# Patient Record
Sex: Male | Born: 1935 | Race: Black or African American | Hispanic: No | Smoking: Former smoker
Health system: Southern US, Community
[De-identification: ages and names within clinical notes are randomized; demographics above are authoritative.]

## PROBLEM LIST (undated history)

## (undated) DIAGNOSIS — Z21 Asymptomatic human immunodeficiency virus [HIV] infection status: Secondary | ICD-10-CM

## (undated) DIAGNOSIS — F0390 Unspecified dementia without behavioral disturbance: Secondary | ICD-10-CM

## (undated) DIAGNOSIS — I10 Essential (primary) hypertension: Secondary | ICD-10-CM

## (undated) DIAGNOSIS — E119 Type 2 diabetes mellitus without complications: Secondary | ICD-10-CM

## (undated) DIAGNOSIS — M1990 Unspecified osteoarthritis, unspecified site: Secondary | ICD-10-CM

## (undated) DIAGNOSIS — B20 Human immunodeficiency virus [HIV] disease: Secondary | ICD-10-CM

---

## 2007-01-17 ENCOUNTER — Other Ambulatory Visit: Payer: Self-pay

## 2007-01-17 ENCOUNTER — Ambulatory Visit: Payer: Self-pay | Admitting: Ophthalmology

## 2007-01-22 ENCOUNTER — Ambulatory Visit: Payer: Self-pay | Admitting: Ophthalmology

## 2007-06-11 ENCOUNTER — Other Ambulatory Visit: Payer: Self-pay

## 2007-06-11 ENCOUNTER — Ambulatory Visit: Payer: Self-pay | Admitting: Ophthalmology

## 2007-07-02 ENCOUNTER — Ambulatory Visit: Payer: Self-pay | Admitting: Ophthalmology

## 2007-08-13 ENCOUNTER — Ambulatory Visit: Payer: Self-pay | Admitting: Family Medicine

## 2007-08-24 ENCOUNTER — Ambulatory Visit: Payer: Self-pay | Admitting: Family Medicine

## 2007-11-19 ENCOUNTER — Other Ambulatory Visit: Payer: Self-pay

## 2007-11-19 ENCOUNTER — Emergency Department: Payer: Self-pay | Admitting: Emergency Medicine

## 2009-06-01 ENCOUNTER — Ambulatory Visit: Payer: Self-pay | Admitting: Gastroenterology

## 2009-06-11 ENCOUNTER — Emergency Department: Payer: Self-pay | Admitting: Internal Medicine

## 2009-10-22 ENCOUNTER — Emergency Department: Payer: Self-pay | Admitting: Emergency Medicine

## 2009-11-20 ENCOUNTER — Observation Stay: Payer: Self-pay | Admitting: Internal Medicine

## 2011-06-30 ENCOUNTER — Emergency Department: Payer: Self-pay | Admitting: Emergency Medicine

## 2011-06-30 LAB — CBC WITH DIFFERENTIAL/PLATELET
Basophil #: 0.1 10*3/uL (ref 0.0–0.1)
Basophil %: 1.3 %
Eosinophil #: 0.2 10*3/uL (ref 0.0–0.7)
HCT: 41.1 % (ref 40.0–52.0)
Lymphocyte #: 2 10*3/uL (ref 1.0–3.6)
Lymphocyte %: 36.2 %
MCH: 35.6 pg — ABNORMAL HIGH (ref 26.0–34.0)
MCHC: 34 g/dL (ref 32.0–36.0)
Monocyte %: 11.9 %
Neutrophil #: 2.5 10*3/uL (ref 1.4–6.5)
Platelet: 198 10*3/uL (ref 150–440)
RDW: 13.3 % (ref 11.5–14.5)
WBC: 5.4 10*3/uL (ref 3.8–10.6)

## 2011-06-30 LAB — COMPREHENSIVE METABOLIC PANEL
Albumin: 3.9 g/dL (ref 3.4–5.0)
Alkaline Phosphatase: 59 U/L (ref 50–136)
BUN: 27 mg/dL — ABNORMAL HIGH (ref 7–18)
Bilirubin,Total: 0.4 mg/dL (ref 0.2–1.0)
Chloride: 105 mmol/L (ref 98–107)
Creatinine: 1.55 mg/dL — ABNORMAL HIGH (ref 0.60–1.30)
Glucose: 188 mg/dL — ABNORMAL HIGH (ref 65–99)
Potassium: 3.9 mmol/L (ref 3.5–5.1)
SGPT (ALT): 34 U/L
Sodium: 142 mmol/L (ref 136–145)
Total Protein: 8.2 g/dL (ref 6.4–8.2)

## 2011-06-30 LAB — LIPASE, BLOOD: Lipase: 146 U/L (ref 73–393)

## 2012-02-20 ENCOUNTER — Ambulatory Visit: Payer: Self-pay | Admitting: Ophthalmology

## 2014-09-05 ENCOUNTER — Emergency Department: Payer: Self-pay | Admitting: Student

## 2015-07-09 ENCOUNTER — Inpatient Hospital Stay
Admission: EM | Admit: 2015-07-09 | Discharge: 2015-07-12 | DRG: 975 | Disposition: A | Discharge status: Skilled Nursing Facility | Payer: Medicare Other | Attending: Internal Medicine | Admitting: Internal Medicine

## 2015-07-09 ENCOUNTER — Encounter: Payer: Self-pay | Admitting: Internal Medicine

## 2015-07-09 ENCOUNTER — Emergency Department: Payer: Medicare Other

## 2015-07-09 DIAGNOSIS — Z993 Dependence on wheelchair: Secondary | ICD-10-CM | POA: Diagnosis not present

## 2015-07-09 DIAGNOSIS — G9341 Metabolic encephalopathy: Secondary | ICD-10-CM | POA: Diagnosis present

## 2015-07-09 DIAGNOSIS — Z8042 Family history of malignant neoplasm of prostate: Secondary | ICD-10-CM

## 2015-07-09 DIAGNOSIS — Z820 Family history of epilepsy and other diseases of the nervous system: Secondary | ICD-10-CM

## 2015-07-09 DIAGNOSIS — J209 Acute bronchitis, unspecified: Secondary | ICD-10-CM | POA: Diagnosis present

## 2015-07-09 DIAGNOSIS — E872 Acidosis: Secondary | ICD-10-CM | POA: Diagnosis present

## 2015-07-09 DIAGNOSIS — Y95 Nosocomial condition: Secondary | ICD-10-CM | POA: Diagnosis present

## 2015-07-09 DIAGNOSIS — J189 Pneumonia, unspecified organism: Principal | ICD-10-CM

## 2015-07-09 DIAGNOSIS — R740 Nonspecific elevation of levels of transaminase and lactic acid dehydrogenase [LDH]: Secondary | ICD-10-CM | POA: Diagnosis present

## 2015-07-09 DIAGNOSIS — F0390 Unspecified dementia without behavioral disturbance: Secondary | ICD-10-CM | POA: Diagnosis present

## 2015-07-09 DIAGNOSIS — I69351 Hemiplegia and hemiparesis following cerebral infarction affecting right dominant side: Secondary | ICD-10-CM

## 2015-07-09 DIAGNOSIS — R400 Somnolence: Secondary | ICD-10-CM

## 2015-07-09 DIAGNOSIS — M1990 Unspecified osteoarthritis, unspecified site: Secondary | ICD-10-CM | POA: Diagnosis present

## 2015-07-09 DIAGNOSIS — E860 Dehydration: Secondary | ICD-10-CM | POA: Diagnosis present

## 2015-07-09 DIAGNOSIS — J0100 Acute maxillary sinusitis, unspecified: Secondary | ICD-10-CM

## 2015-07-09 DIAGNOSIS — Z7401 Bed confinement status: Secondary | ICD-10-CM

## 2015-07-09 DIAGNOSIS — I10 Essential (primary) hypertension: Secondary | ICD-10-CM | POA: Diagnosis present

## 2015-07-09 DIAGNOSIS — N179 Acute kidney failure, unspecified: Secondary | ICD-10-CM | POA: Diagnosis present

## 2015-07-09 DIAGNOSIS — B20 Human immunodeficiency virus [HIV] disease: Secondary | ICD-10-CM | POA: Diagnosis present

## 2015-07-09 DIAGNOSIS — E119 Type 2 diabetes mellitus without complications: Secondary | ICD-10-CM | POA: Diagnosis present

## 2015-07-09 DIAGNOSIS — Z888 Allergy status to other drugs, medicaments and biological substances status: Secondary | ICD-10-CM | POA: Diagnosis not present

## 2015-07-09 DIAGNOSIS — Z87891 Personal history of nicotine dependence: Secondary | ICD-10-CM | POA: Diagnosis not present

## 2015-07-09 DIAGNOSIS — R7989 Other specified abnormal findings of blood chemistry: Secondary | ICD-10-CM

## 2015-07-09 HISTORY — DX: Unspecified osteoarthritis, unspecified site: M19.90

## 2015-07-09 HISTORY — DX: Asymptomatic human immunodeficiency virus (hiv) infection status: Z21

## 2015-07-09 HISTORY — DX: Unspecified dementia, unspecified severity, without behavioral disturbance, psychotic disturbance, mood disturbance, and anxiety: F03.90

## 2015-07-09 HISTORY — DX: Human immunodeficiency virus (HIV) disease: B20

## 2015-07-09 HISTORY — DX: Type 2 diabetes mellitus without complications: E11.9

## 2015-07-09 HISTORY — DX: Essential (primary) hypertension: I10

## 2015-07-09 LAB — COMPREHENSIVE METABOLIC PANEL
ALK PHOS: 54 U/L (ref 38–126)
ALT: 27 U/L (ref 17–63)
ANION GAP: 10 (ref 5–15)
AST: 78 U/L — ABNORMAL HIGH (ref 15–41)
Albumin: 3 g/dL — ABNORMAL LOW (ref 3.5–5.0)
BILIRUBIN TOTAL: 1.1 mg/dL (ref 0.3–1.2)
BUN: 29 mg/dL — ABNORMAL HIGH (ref 6–20)
CALCIUM: 9.1 mg/dL (ref 8.9–10.3)
CO2: 27 mmol/L (ref 22–32)
Chloride: 104 mmol/L (ref 101–111)
Creatinine, Ser: 1.7 mg/dL — ABNORMAL HIGH (ref 0.61–1.24)
GFR calc non Af Amer: 34 mL/min — ABNORMAL LOW (ref >60)
GFR, EST AFRICAN AMERICAN: 39 mL/min — AB (ref >60)
Glucose, Bld: 226 mg/dL — ABNORMAL HIGH (ref 65–99)
Potassium: 4.2 mmol/L (ref 3.5–5.1)
Sodium: 141 mmol/L (ref 135–145)
TOTAL PROTEIN: 7.6 g/dL (ref 6.5–8.1)

## 2015-07-09 LAB — TROPONIN I: Troponin I: <0.03 ng/mL (ref <0.031)

## 2015-07-09 LAB — CBC WITH DIFFERENTIAL/PLATELET
BASOS ABS: 0 10*3/uL (ref 0–0.1)
Basophils Relative: 1 %
Eosinophils Absolute: 0 10*3/uL (ref 0–0.7)
Eosinophils Relative: 0 %
HEMATOCRIT: 40.3 % (ref 40.0–52.0)
Hemoglobin: 13.2 g/dL (ref 13.0–18.0)
LYMPHS ABS: 0.9 10*3/uL — AB (ref 1.0–3.6)
LYMPHS PCT: 14 %
MCH: 31.5 pg (ref 26.0–34.0)
MCHC: 32.8 g/dL (ref 32.0–36.0)
MCV: 96.1 fL (ref 80.0–100.0)
MONO ABS: 0.9 10*3/uL (ref 0.2–1.0)
MONOS PCT: 15 %
Neutro Abs: 4.1 10*3/uL (ref 1.4–6.5)
Neutrophils Relative %: 70 %
Platelets: 116 10*3/uL — ABNORMAL LOW (ref 150–440)
RBC: 4.19 MIL/uL — ABNORMAL LOW (ref 4.40–5.90)
RDW: 13.1 % (ref 11.5–14.5)
WBC: 6 10*3/uL (ref 3.8–10.6)

## 2015-07-09 LAB — URINALYSIS COMPLETE WITH MICROSCOPIC (ARMC ONLY)
BILIRUBIN URINE: NEGATIVE
Cellular Cast, UA: 6
Glucose, UA: 50 mg/dL — AB
HGB URINE DIPSTICK: NEGATIVE
KETONES UR: NEGATIVE mg/dL
LEUKOCYTES UA: NEGATIVE
Nitrite: NEGATIVE
PH: 5 (ref 5.0–8.0)
Protein, ur: 100 mg/dL — AB
Specific Gravity, Urine: 1.024 (ref 1.005–1.030)

## 2015-07-09 LAB — GLUCOSE, CAPILLARY: GLUCOSE-CAPILLARY: 205 mg/dL — AB (ref 65–99)

## 2015-07-09 LAB — LACTIC ACID, PLASMA
LACTIC ACID, VENOUS: 2.1 mmol/L — AB (ref 0.5–2.0)
LACTIC ACID, VENOUS: 1.8 mmol/L (ref 0.5–2.0)

## 2015-07-09 LAB — VALPROIC ACID LEVEL: Valproic Acid Lvl: 47 ug/mL — ABNORMAL LOW (ref 50.0–100.0)

## 2015-07-09 LAB — BRAIN NATRIURETIC PEPTIDE: B Natriuretic Peptide: 255 pg/mL — ABNORMAL HIGH (ref 0.0–100.0)

## 2015-07-09 MED ORDER — DEXTROSE 5 % IV SOLN
250.0000 mg | INTRAVENOUS | Status: DC
  Administered 2015-07-09 – 2015-07-10 (×2): 250 mg via INTRAVENOUS
  Filled 2015-07-09 (×3): qty 250

## 2015-07-09 MED ORDER — ONDANSETRON HCL 4 MG PO TABS: 4.0000 mg | ORAL_TABLET | ORAL | Status: DC | PRN

## 2015-07-09 MED ORDER — PIPERACILLIN-TAZOBACTAM 3.375 G IVPB
3.3750 g | Freq: Once | INTRAVENOUS | Status: AC
  Administered 2015-07-09: 3.375 g via INTRAVENOUS
  Filled 2015-07-09: qty 50

## 2015-07-09 MED ORDER — NEVIRAPINE 200 MG PO TABS
200.0000 mg | ORAL_TABLET | Freq: Every day | ORAL | Status: DC
  Filled 2015-07-09 (×2): qty 1

## 2015-07-09 MED ORDER — DOCUSATE SODIUM 100 MG PO CAPS
100.0000 mg | ORAL_CAPSULE | Freq: Every day | ORAL | Status: DC
  Administered 2015-07-10 – 2015-07-12 (×3): 100 mg via ORAL
  Filled 2015-07-09 (×4): qty 1

## 2015-07-09 MED ORDER — MEMANTINE HCL ER 14 MG PO CP24
28.0000 mg | ORAL_CAPSULE | Freq: Every day | ORAL | Status: DC
  Administered 2015-07-10 – 2015-07-12 (×3): 28 mg via ORAL
  Filled 2015-07-09 (×3): qty 2

## 2015-07-09 MED ORDER — SULINDAC 200 MG PO TABS
100.0000 mg | ORAL_TABLET | Freq: Two times a day (BID) | ORAL | Status: DC
Start: 2015-07-09 — End: 2015-07-12
  Administered 2015-07-09 – 2015-07-12 (×6): 100 mg via ORAL
  Filled 2015-07-09 (×7): qty 1

## 2015-07-09 MED ORDER — LATANOPROST 0.005 % OP SOLN
1.0000 [drp] | Freq: Every day | OPHTHALMIC | Status: DC
  Administered 2015-07-09 – 2015-07-11 (×3): 1 [drp] via OPHTHALMIC
  Filled 2015-07-09: qty 2.5

## 2015-07-09 MED ORDER — OMEPRAZOLE MAGNESIUM 20 MG PO TBEC: 20.0000 mg | DELAYED_RELEASE_TABLET | Freq: Every day | ORAL | Status: DC

## 2015-07-09 MED ORDER — DORZOLAMIDE HCL-TIMOLOL MAL 2-0.5 % OP SOLN
1.0000 [drp] | Freq: Two times a day (BID) | OPHTHALMIC | Status: DC
  Administered 2015-07-09 – 2015-07-12 (×6): 1 [drp] via OPHTHALMIC
  Filled 2015-07-09: qty 10

## 2015-07-09 MED ORDER — INSULIN GLARGINE 100 UNIT/ML ~~LOC~~ SOLN
20.0000 [IU] | Freq: Every day | SUBCUTANEOUS | Status: DC
  Administered 2015-07-09 – 2015-07-10 (×2): 20 [IU] via SUBCUTANEOUS
  Filled 2015-07-09 (×3): qty 0.2

## 2015-07-09 MED ORDER — PANTOPRAZOLE SODIUM 40 MG PO TBEC
40.0000 mg | DELAYED_RELEASE_TABLET | Freq: Every day | ORAL | Status: DC
  Administered 2015-07-10 – 2015-07-12 (×3): 40 mg via ORAL
  Filled 2015-07-09 (×4): qty 1

## 2015-07-09 MED ORDER — DIVALPROEX SODIUM 250 MG PO DR TAB
250.0000 mg | DELAYED_RELEASE_TABLET | Freq: Two times a day (BID) | ORAL | Status: DC
  Administered 2015-07-09 – 2015-07-12 (×6): 250 mg via ORAL
  Filled 2015-07-09 (×6): qty 1

## 2015-07-09 MED ORDER — SODIUM CHLORIDE 0.9 % IV SOLN
INTRAVENOUS | Status: DC
  Administered 2015-07-09 – 2015-07-12 (×5): via INTRAVENOUS

## 2015-07-09 MED ORDER — DEXTROSE 5 % IV SOLN
1.0000 g | INTRAVENOUS | Status: DC
  Administered 2015-07-09 – 2015-07-11 (×3): 1 g via INTRAVENOUS
  Filled 2015-07-09 (×4): qty 10

## 2015-07-09 MED ORDER — ASPIRIN 81 MG PO TABS: 81.0000 mg | ORAL_TABLET | Freq: Every day | ORAL | Status: DC

## 2015-07-09 MED ORDER — ASPIRIN EC 81 MG PO TBEC
81.0000 mg | DELAYED_RELEASE_TABLET | Freq: Every day | ORAL | Status: DC
  Administered 2015-07-10 – 2015-07-12 (×3): 81 mg via ORAL
  Filled 2015-07-09 (×4): qty 1

## 2015-07-09 MED ORDER — INSULIN GLARGINE 100 UNITS/ML SOLOSTAR PEN
20.0000 [IU] | PEN_INJECTOR | Freq: Every day | SUBCUTANEOUS | Status: DC
  Filled 2015-07-09: qty 3

## 2015-07-09 MED ORDER — HEPARIN SODIUM (PORCINE) 5000 UNIT/ML IJ SOLN
5000.0000 [IU] | Freq: Three times a day (TID) | INTRAMUSCULAR | Status: DC
  Administered 2015-07-09 – 2015-07-11 (×5): 5000 [IU] via SUBCUTANEOUS
  Filled 2015-07-09 (×5): qty 1

## 2015-07-09 MED ORDER — ADULT MULTIVITAMIN W/MINERALS CH
1.0000 | ORAL_TABLET | Freq: Every day | ORAL | Status: DC
  Administered 2015-07-10 – 2015-07-12 (×3): 1 via ORAL
  Filled 2015-07-09 (×3): qty 1

## 2015-07-09 MED ORDER — EMTRICITABINE-TENOFOVIR AF 200-25 MG PO TABS
1.0000 | ORAL_TABLET | Freq: Every day | ORAL | Status: DC
  Administered 2015-07-10: 1 via ORAL
  Filled 2015-07-09 (×2): qty 1

## 2015-07-09 NOTE — H&P (Addendum)
[facility] Physicians - [place] at [facility]   PATIENT NAME: Terry Perry    MR#:  [id number]  DATE OF BIRTH:  Oct 14, 1922  DATE OF ADMISSION:  07/09/2015  PRIMARY CARE PHYSICIAN: Derwood Kaplan, MD   REQUESTING/REFERRING PHYSICIAN: Darnelle Catalan  CHIEF COMPLAINT:   Chief Complaint  Patient presents with  . Altered Mental Status    HISTORY OF PRESENT ILLNESS: Jabez Molner  is a 82 y.o. male with a known history of diabetes, hypertension, dementia, HIV, arthritis- lives at nursing home and wheelchair-bound, was seen completely at his baseline 2 days ago by daughter at nursing home. Today she got a call from the nursing home that he is drowsy and so they have to transfer him to emergency room. Daughter present in the room in ER and she agrees that her father looks more drowsy and lethargic today. She denies any complaints by him. In workup he was noted to have slightly elevated lactic acid and borderline low blood pressure. Chest x-ray was having some questionable infiltrate or atelectasis, and there is no other source of infection. She is given his admission to hospitalist team for possible pneumonia.  PAST MEDICAL HISTORY:   Past Medical History  Diagnosis Date  . Diabetes mellitus without complication (HCC)   . Hypertension   . Dementia   . Arthritis   . HIV (human immunodeficiency virus infection) (HCC)     PAST SURGICAL HISTORY: No past surgical history on file.  SOCIAL HISTORY:  Social History  Substance Use Topics  . Smoking status: Former Games developer  . Smokeless tobacco: Not on file  . Alcohol Use: No    FAMILY HISTORY:  Family History  Problem Relation Age of Onset  . Prostate cancer Father   . Alzheimer's disease Sister     DRUG ALLERGIES:  Allergies  Allergen Reactions  . Aricept [Donepezil Hcl] Other (See Comments)    Unknown reaction    REVIEW OF SYSTEMS:   Currently drowsy.  MEDICATIONS AT HOME:  Prior to Admission medications   Not on  File      PHYSICAL EXAMINATION:   VITAL SIGNS: Blood pressure 119/69, pulse 67, temperature 98.2 F (36.8 C), temperature source Oral, resp. rate 22, weight 97.977 kg (216 lb), SpO2 96 %.  GENERAL:  82 y.o.-year-old patient lying in the bed with no acute distress.  EYES: Pupils equal, round, reactive to light. No scleral icterus. Extraocular muscles intact.  HEENT: Head atraumatic, normocephalic. Oropharynx and nasopharynx clear.  NECK:  Supple, no jugular venous distention. No thyroid enlargement, no tenderness.  LUNGS: Normal breath sounds bilaterally, no Wheezing, mild crepitation. No use of accessory muscles of respiration.  on 2 L oxygen supplementation.  CARDIOVASCULAR: S1, S2 normal. No murmurs, rubs, or gallops.  ABDOMEN: Soft, nontender, nondistended. Bowel sounds present. No organomegaly or mass.  EXTREMITIES: No pedal edema, cyanosis, or clubbing.  NEUROLOGY: Patient is drowsy and arousable to stimuli. But does not follow, and due to dementia. PsychIATRIC: The patient is drowsy.  SKIN: No obvious rash, lesion, or ulcer.   LABORATORY PANEL:   CBC  Recent Labs Lab 07/09/15 1526  WBC 6.0  HGB 13.2  HCT 40.3  PLT 116*  MCV 96.1  MCH 31.5  MCHC 32.8  RDW 13.1  LYMPHSABS 0.9*  MONOABS 0.9  EOSABS 0.0  BASOSABS 0.0   ------------------------------------------------------------------------------------------------------------------  Chemistries   Recent Labs Lab 07/09/15 1602  NA 141  K 4.2  CL 104  CO2 27  GLUCOSE 226*  BUN 29*  CREATININE 1.70*  CALCIUM 9.1  AST 78*  ALT 27  ALKPHOS 54  BILITOT 1.1   ------------------------------------------------------------------------------------------------------------------ CrCl cannot be calculated (Unknown ideal weight.). ------------------------------------------------------------------------------------------------------------------ No results for input(s): TSH, T4TOTAL, T3FREE, THYROIDAB in the last 72  hours.  Invalid input(s): FREET3   Coagulation profile No results for input(s): INR, PROTIME in the last 168 hours. ------------------------------------------------------------------------------------------------------------------- No results for input(s): DDIMER in the last 72 hours. -------------------------------------------------------------------------------------------------------------------  Cardiac Enzymes  Recent Labs Lab 07/09/15 1602  TROPONINI <0.03   ------------------------------------------------------------------------------------------------------------------ Invalid input(s): POCBNP  ---------------------------------------------------------------------------------------------------------------  Urinalysis    Component Value Date/Time   COLORURINE AMBER* 07/09/2015 1710   APPEARANCEUR HAZY* 07/09/2015 1710   LABSPEC 1.024 07/09/2015 1710   PHURINE 5.0 07/09/2015 1710   GLUCOSEU 50* 07/09/2015 1710   HGBUR NEGATIVE 07/09/2015 1710   BILIRUBINUR NEGATIVE 07/09/2015 1710   KETONESUR NEGATIVE 07/09/2015 1710   PROTEINUR 100* 07/09/2015 1710   NITRITE NEGATIVE 07/09/2015 1710   LEUKOCYTESUR NEGATIVE 07/09/2015 1710     RADIOLOGY: Ct Head Wo Contrast  07/09/2015  CLINICAL DATA:  Found unconscious. Decreased mental status. Right facial droop. EXAM: CT HEAD WITHOUT CONTRAST TECHNIQUE: Contiguous axial images were obtained from the base of the skull through the vertex without intravenous contrast. COMPARISON:  11/19/2009 FINDINGS: There is no evidence of intracranial hemorrhage, brain edema, or other signs of acute infarction. There is no evidence of intracranial mass lesion or mass effect. No abnormal extraaxial fluid collections are identified. Moderate cerebral atrophy and extensive chronic small vessel disease are again demonstrated. Ventriculomegaly again noted. No skull abnormality identified. Air-fluid levels are noted in the left frontal and bilateral  maxillary sinuses, suspicious for acute sinusitis. Mucosal thickening also seen involving the ethmoid sinuses. IMPRESSION: No acute intracranial abnormality. Cerebral atrophy and chronic small vessel disease. Bilateral maxillary and left frontal sinus air-fluid levels, suspicious for acute sinusitis. Electronically Signed   By: Myles Rosenthal M.D.   On: 07/09/2015 15:45   Dg Chest Portable 1 View  07/09/2015  CLINICAL DATA:  Right-sided facial droop today resolved. EXAM: PORTABLE CHEST 1 VIEW COMPARISON:  06/11/2009 FINDINGS: Lungs are somewhat hypoinflated with bibasilar opacification left worse than right which may be due to atelectasis or infection. Cannot exclude a small amount of bilateral pleural fluid. Borderline cardiomegaly. Mild calcified plaque over the aortic arch. Degenerative changes of the shoulders. IMPRESSION: Hypoinflation with bibasilar opacification which may be due to atelectasis or infection. Cannot exclude a small amount of bilateral pleural fluid. Electronically Signed   By: Elberta Fortis M.D.   On: 07/09/2015 16:32    EKG: Orders placed or performed during the hospital encounter of 07/09/15  . ED EKG  . ED EKG  . EKG 12-Lead  . EKG 12-Lead    IMPRESSION AND PLAN:  * Altered mental status likely metabolic encephalopathy secondary to possible pneumonia   blood sugar level is checked and it is normal.  We will treat pneumonia with antibiotic and follow for improvement.   Ordered depakote level.  *  pneumonia   this is suspected diagnosis at this time as there is no other source of his mental status change and lactic acidosis.  We'll give IV Rocephin and azithromycin for this point and check his sputum culture.  Repeat chest x-ray two-view tomorrow for having better radiological evidence.  With HIV , he may not have Full response by rise in WBCs.  * Lactic acidosis  Blood pressure is running slightly on the lower normal side  I will  give IV fluid and follow  accordingly.  * HIV  Continue the medication he was taking at nursing home.  * Diabetes  We'll keep him on insulin sliding scale coverage.  * Hypertension  All the medication his blood pressure is running on lower normal side.  All the records are reviewed and case discussed with ED provider. Management plans discussed with the patient, family and they are in agreement.  CODE STATUS: full code - patient have a living will and power of attorney papers done daughter does not remember what exactly mentioning those people she will have a look and discussed with her other sisters and let us know.   Code Status History    This patient does not have a recorded code status. Please follow your organizational policy for patients in this situation.       TOTAL TIME TAKING CARE OF THIS PATIENT: 50 minutes.    Altamese DillingVACHHANI, [place] Bowlds M.D on 07/09/2015   Between 7am to 6pm - Pager - [phone]  After 6pm go to [email or web address] - password [email or web address] [email or web address]  [facility]  Office  [phone]  CC: Primary care physician; Derwood KaplanEason,  Ernest B, MD   Note: This dictation was prepared with Dragon dictation along with smaller phrase technology. Any transcriptional errors that result from this process are unintentional.

## 2015-07-09 NOTE — ED Notes (Signed)
Pt came via EMS. Per EMS, pt was "unconscious" since 7am. Pt presents to ED, drowsy, but arousable. EMS reports facial dropping on right side that goes away when pt wakes up. Pt in NSR w/ EMS. Placed on 2 L [place]. Pt not normally on oxygen.

## 2015-07-09 NOTE — ED Provider Notes (Signed)
[facility] Emergency Department Provider Note  ____________________________________________  Time seen: Approximately 3:43 PM  I have reviewed the triage vital signs and the nursing notes.   HISTORY  Chief Complaint Altered Mental Status    HPI Terry Perry is a 82 y.o. male who is sent from [facility] with a complaint of being unconscious and 7 AM. On arrival here patient is very easy to arouse says he doesn't know why he is here last who sent him here and how they got the floor didn't do it. He denies any headache sore throat nausea vomiting fever chills coughing abdominal pain chest pain dysuria etc. says he feels fine and wants to go back to the rest home. I told him his daughter wanted him to come and is on his way here. I will ask her for further details.  MS reports patient is HIV positive his fingerstick was in the 200s and had a recent kidney infection. No past medical history on file.  There are no active problems to display for this patient.   No past surgical history on file.  No current outpatient prescriptions on file.  Allergies Review of patient's allergies indicates no known allergies.  No family history on file.  Social History Social History  Substance Use Topics  . Smoking status: Not on file  . Smokeless tobacco: Not on file  . Alcohol Use: Not on file    Review of Systems Constitutional: No fever/chills Eyes: No visual changes. ENT: No sore throat. Cardiovascular: Denies chest pain. Respiratory: Denies shortness of breath. Gastrointestinal: No abdominal pain.  No nausea, no vomiting.  No diarrhea.  No constipation. Genitourinary: Negative for dysuria. Musculoskeletal: Negative for back pain. Skin: Negative for rash. Neurological: Negative for headaches, focal weakness or numbness.  10-point ROS otherwise negative.  ____________________________________________   PHYSICAL EXAM:  VITAL SIGNS: ED  Triage Vitals  Enc Vitals Group     BP --      Pulse Rate 07/09/15 1522 68     Resp 07/09/15 1522 22     Temp 07/09/15 1522 98.2 F (36.8 C)     Temp Source 07/09/15 1522 Oral     SpO2 07/09/15 1522 100 %     Weight 07/09/15 1522 216 lb (97.977 kg)     Height --      Head Cir --      Peak Flow --      Pain Score --      Pain Loc --      Pain Edu? --      Excl. in GC? --     Constitutional: Alert and oriented. Well appearing and in no acute distress. Eyes: Conjunctivae are normal. PERRL. EOMI. Head: Atraumatic. Nose: No congestion/rhinnorhea. Mouth/Throat: Mucous membranes are moist.  Oropharynx non-erythematous. Neck: No stridor.  Supple no nodes  Cardiovascular: Normal rate, regular rhythm. Grossly normal heart sounds.  Good peripheral circulation. Respiratory: Normal respiratory effort.  No retractions. Lungs CTAB. Gastrointestinal: Soft and nontender. No distention. No abdominal bruits. No CVA tenderness. Musculoskeletal: No lower extremity tenderness nor edema.  No joint effusions. Neurologic:  Normal speech and language. No gross focal neurologic deficits are appreciated. N Skin:  Skin is warm, dry and intact. No rash noted. Psychiatric: Mood and affect are normal. Speech and behavior are normal.  ____________________________________________   LABS (all labs ordered are listed, but only abnormal results are displayed)  Labs Reviewed  CBC WITH DIFFERENTIAL/PLATELET - Abnormal; Notable for the following:  RBC 4.19 (*)    Platelets 116 (*)    Lymphs Abs 0.9 (*)    All other components within normal limits  URINALYSIS COMPLETEWITH MICROSCOPIC ([facility] ONLY) - Abnormal; Notable for the following:    Color, Urine AMBER (*)    APPearance HAZY (*)    Glucose, UA 50 (*)    Protein, ur 100 (*)    Bacteria, UA RARE (*)    Squamous Epithelial / LPF 0-5 (*)    All other components within normal limits  BRAIN NATRIURETIC PEPTIDE - Abnormal; Notable for the following:    B  Natriuretic Peptide 255.0 (*)    All other components within normal limits  COMPREHENSIVE METABOLIC PANEL - Abnormal; Notable for the following:    Glucose, Bld 226 (*)    BUN 29 (*)    Creatinine, Ser 1.70 (*)    Albumin 3.0 (*)    AST 78 (*)    GFR calc non Af Amer 34 (*)    GFR calc Af Amer 39 (*)    All other components within normal limits  LACTIC ACID, PLASMA - Abnormal; Notable for the following:    Lactic Acid, Venous 2.1 (*)    All other components within normal limits  GLUCOSE, CAPILLARY - Abnormal; Notable for the following:    Glucose-Capillary 205 (*)    All other components within normal limits  CULTURE, BLOOD (ROUTINE X 2)  CULTURE, BLOOD (ROUTINE X 2)  TROPONIN I  LACTIC ACID, PLASMA  LACTIC ACID, PLASMA   ____________________________________________  EKG  EKG read and interpreted by me shows normal sinus rhythm rate of 68 left axis equal and PVCs somewhat widened QRS with 114 ms duration no acute changes ____________________________________________  RADIOLOGY  CT of the head shows maxillary and frontal sinusitis bilateral maxillary left frontal per radiology. Chest x-ray chest x-ray read by radiology shows ____________________________________________   PROCEDURES  Patient is much less alert per his daughter who has come he does not recognize his daughter. His daughter reports that this is the first time ever happe. ____________________________________________   INITIAL IMPRESSION / ASSESSMENT AND PLAN / ED COURSE  Pertinent labs & imaging results that were available during my care of the patient were reviewed by me and considered in my medical decision making (see chart for details).   ____________________________________________   FINAL CLINICAL IMPRESSION(S) / ED DIAGNOSES  Final diagnoses:  Somnolence  Acute maxillary sinusitis, recurrence not specified  Healthcare-associated pneumonia  Elevated lactic acid level      Arnaldo Natal,  MD 07/09/15 1754

## 2015-07-09 NOTE — ED Notes (Signed)
Pt seems sleepy with facial droop. Once patient is talked to, pt is alert and responsive with no facial droop.

## 2015-07-10 ENCOUNTER — Inpatient Hospital Stay: Payer: Medicare Other

## 2015-07-10 LAB — LACTIC ACID, PLASMA: LACTIC ACID, VENOUS: 1.2 mmol/L (ref 0.5–2.0)

## 2015-07-10 LAB — BASIC METABOLIC PANEL
Anion gap: 5 (ref 5–15)
BUN: 30 mg/dL — AB (ref 6–20)
CHLORIDE: 108 mmol/L (ref 101–111)
CO2: 26 mmol/L (ref 22–32)
CREATININE: 1.4 mg/dL — AB (ref 0.61–1.24)
Calcium: 8.3 mg/dL — ABNORMAL LOW (ref 8.9–10.3)
GFR calc Af Amer: 49 mL/min — ABNORMAL LOW (ref >60)
GFR, EST NON AFRICAN AMERICAN: 43 mL/min — AB (ref >60)
GLUCOSE: 163 mg/dL — AB (ref 65–99)
Potassium: 3.8 mmol/L (ref 3.5–5.1)
SODIUM: 139 mmol/L (ref 135–145)

## 2015-07-10 LAB — GLUCOSE, CAPILLARY
GLUCOSE-CAPILLARY: 174 mg/dL — AB (ref 65–99)
GLUCOSE-CAPILLARY: 149 mg/dL — AB (ref 65–99)
GLUCOSE-CAPILLARY: 82 mg/dL (ref 65–99)
GLUCOSE-CAPILLARY: 107 mg/dL — AB (ref 65–99)
Glucose-Capillary: 181 mg/dL — ABNORMAL HIGH (ref 65–99)
Glucose-Capillary: 117 mg/dL — ABNORMAL HIGH (ref 65–99)

## 2015-07-10 LAB — CBC
HEMATOCRIT: 35.9 % — AB (ref 40.0–52.0)
Hemoglobin: 12.4 g/dL — ABNORMAL LOW (ref 13.0–18.0)
MCH: 32.4 pg (ref 26.0–34.0)
MCHC: 34.4 g/dL (ref 32.0–36.0)
MCV: 94.1 fL (ref 80.0–100.0)
PLATELETS: 105 10*3/uL — AB (ref 150–440)
RBC: 3.82 MIL/uL — ABNORMAL LOW (ref 4.40–5.90)
RDW: 12.8 % (ref 11.5–14.5)
WBC: 6.4 10*3/uL (ref 3.8–10.6)

## 2015-07-10 LAB — MRSA PCR SCREENING: MRSA by PCR: NEGATIVE

## 2015-07-10 MED ORDER — NEVIRAPINE 200 MG PO TABS
200.0000 mg | ORAL_TABLET | Freq: Once | ORAL | Status: AC
  Administered 2015-07-10: 14:00:00 200 mg via ORAL
  Filled 2015-07-10: qty 1

## 2015-07-10 MED ORDER — NEVIRAPINE 200 MG PO TABS
200.0000 mg | ORAL_TABLET | Freq: Every day | ORAL | Status: DC
  Administered 2015-07-11 – 2015-07-12 (×2): 200 mg via ORAL
  Filled 2015-07-10: qty 1

## 2015-07-10 MED ORDER — EMTRICITABINE-TENOFOVIR AF 200-25 MG PO TABS
1.0000 | ORAL_TABLET | Freq: Every day | ORAL | Status: DC
  Administered 2015-07-11 – 2015-07-12 (×2): 1 via ORAL
  Filled 2015-07-10 (×3): qty 1

## 2015-07-10 NOTE — Plan of Care (Signed)
Problem: Education: Goal: Knowledge of [facility] Education information/materials will improve Outcome: Not Progressing Pt lethargic, but easily aroused. Alert to self only.  Problem: Fluid Volume: Goal: Ability to maintain a balanced intake and output will improve Outcome: Progressing Poor PO intake. Pt encouraged to eat and drink. Need assistance with meals.  Incontinent. Voided once during the shift. Bladder scan results .

## 2015-07-10 NOTE — Progress Notes (Addendum)
[facility] - [place] at [facility]   PATIENT NAME: Terry PinaLuther Perry    MR#:  [id number]  DATE OF BIRTH:  10-03-1922  SUBJECTIVE:  CHIEF COMPLAINT:  Patient is lethargic but arousable and answers questions. Denies any chest pain or shortness of breath. Minimal cough. No family members at bedside  REVIEW OF SYSTEMS:  CONSTITUTIONAL: No fever, fatigue or weakness.  EYES: No blurred or double vision.  EARS, NOSE, AND THROAT: No tinnitus or ear pain.  RESPIRATORY: Minimal cough, denies shortness of breath, wheezing or hemoptysis.  CARDIOVASCULAR: No chest pain, orthopnea, edema.  GASTROINTESTINAL: No nausea, vomiting, diarrhea or abdominal pain.  GENITOURINARY: No dysuria, hematuria.  ENDOCRINE: No polyuria, nocturia,  HEMATOLOGY: No anemia, easy bruising or bleeding SKIN: No rash or lesion. MUSCULOSKELETAL: No joint pain or arthritis.   NEUROLOGIC: No tingling, numbness, weakness. Sleepy but arousable PSYCHIATRY: No anxiety or depression.   DRUG ALLERGIES:   Allergies  Allergen Reactions  . Aricept [Donepezil Hcl] Other (See Comments)    Unknown reaction    VITALS:  Blood pressure 106/64, pulse 58, temperature 98.9 F (37.2 C), temperature source Oral, resp. rate 20, height 5\' 7"  (1.702 m), weight 91.173 kg (201 lb), SpO2 100 %.  PHYSICAL EXAMINATION:  GENERAL:  82 y.o.-year-old patient lying in the bed with no acute distress.  EYES: Pupils equal, round, reactive to light and accommodation. No scleral icterus. Extraocular muscles intact.  HEENT: Head atraumatic, normocephalic. Oropharynx and nasopharynx clear.  NECK:  Supple, no jugular venous distention. No thyroid enlargement, no tenderness.  LUNGS: Normal breath sounds bilaterally, no wheezing, rales,rhonchi or crepitation. No use of accessory muscles of respiration.  CARDIOVASCULAR: S1, S2 normal. No murmurs, rubs, or gallops.  ABDOMEN: Soft, nontender, nondistended. Bowel sounds present. No  organomegaly or mass.  EXTREMITIES: No pedal edema, cyanosis, or clubbing.  NEUROLOGIC: Lethargic but arousable and follows verbal commands . Sensation intact. Gait not checked.  PSYCHIATRIC: The patient is alert and oriented x 2-3.  SKIN: No obvious rash, lesion, or ulcer.    LABORATORY PANEL:   CBC  Recent Labs Lab 07/10/15 0534  WBC 6.4  HGB 12.4*  HCT 35.9*  PLT 105*   ------------------------------------------------------------------------------------------------------------------  Chemistries   Recent Labs Lab 07/09/15 1602 07/10/15 0534  NA 141 139  K 4.2 3.8  CL 104 108  CO2 27 26  GLUCOSE 226* 163*  BUN 29* 30*  CREATININE 1.70* 1.40*  CALCIUM 9.1 8.3*  AST 78*  --   ALT 27  --   ALKPHOS 54  --   BILITOT 1.1  --    ------------------------------------------------------------------------------------------------------------------  Cardiac Enzymes  Recent Labs Lab 07/09/15 1602  TROPONINI <0.03   ------------------------------------------------------------------------------------------------------------------  RADIOLOGY:  Dg Chest 2 View  07/10/2015  CLINICAL DATA:  RIGHT-side drooping, unconscious, poor historian, former smoker, diabetes mellitus, hypertension, HIV, former smoker EXAM: CHEST  2 VIEW COMPARISON:  07/09/2015 FINDINGS: Upper normal heart size. Tortuous aorta. Rotated to the RIGHT with slight prominence of the RIGHT superior mediastinum unchanged. Bibasilar atelectasis. Bronchitic changes and questionable underlying emphysematous changes. Upper lungs clear. Tiny LEFT pleural effusion. No pneumothorax. BILATERAL glenohumeral degenerative changes. Diffuse osseous demineralization. IMPRESSION: Bronchitic and question emphysematous changes with bibasilar atelectasis and tiny LEFT pleural effusion. Electronically Signed   By: Ulyses SouthwardMark  Boles M.D.   On: 07/10/2015 11:39   Ct Head Wo Contrast  07/09/2015  CLINICAL DATA:  Found unconscious. Decreased  mental status. Right facial droop. EXAM: CT HEAD WITHOUT CONTRAST TECHNIQUE: Contiguous axial images  were obtained from the base of the skull through the vertex without intravenous contrast. COMPARISON:  11/19/2009 FINDINGS: There is no evidence of intracranial hemorrhage, brain edema, or other signs of acute infarction. There is no evidence of intracranial mass lesion or mass effect. No abnormal extraaxial fluid collections are identified. Moderate cerebral atrophy and extensive chronic small vessel disease are again demonstrated. Ventriculomegaly again noted. No skull abnormality identified. Air-fluid levels are noted in the left frontal and bilateral maxillary sinuses, suspicious for acute sinusitis. Mucosal thickening also seen involving the ethmoid sinuses. IMPRESSION: No acute intracranial abnormality. Cerebral atrophy and chronic small vessel disease. Bilateral maxillary and left frontal sinus air-fluid levels, suspicious for acute sinusitis. Electronically Signed   By: Myles Rosenthal M.D.   On: 07/09/2015 15:45   Dg Chest Portable 1 View  07/09/2015  CLINICAL DATA:  Right-sided facial droop today resolved. EXAM: PORTABLE CHEST 1 VIEW COMPARISON:  06/11/2009 FINDINGS: Lungs are somewhat hypoinflated with bibasilar opacification left worse than right which may be due to atelectasis or infection. Cannot exclude a small amount of bilateral pleural fluid. Borderline cardiomegaly. Mild calcified plaque over the aortic arch. Degenerative changes of the shoulders. IMPRESSION: Hypoinflation with bibasilar opacification which may be due to atelectasis or infection. Cannot exclude a small amount of bilateral pleural fluid. Electronically Signed   By: Elberta Fortis M.D.   On: 07/09/2015 16:32    EKG:   Orders placed or performed during the hospital encounter of 07/09/15  . ED EKG  . ED EKG  . EKG 12-Lead  . EKG 12-Lead    ASSESSMENT AND PLAN:   * AMS likely metabolic encephalopathy secondary to acute  bronchitis and acute sinusitis  Mentating better today Continue antibiotics  * Acute bronchitis and acute sinusitis We'll continue IV Rocephin and azithromycin for this point and check his sputum culture. Repeat chest x-ray two-view today has revealed bronchitic changes and emphysematous changes   * Acute kidney injury probably from dehydration Continue IV fluids Creatinine is improving-1.70-1.40  * HIV Continue the medication Descovy and viramune  he was taking at nursing home.  * Diabetes on insulin sliding scale coverage.  * Hypertension Hold  the medication his blood pressure is running on lower normal side.     All the records are reviewed and case discussed with Care Management/Social Workerr. Management plans discussed with the patient, family and they are in agreement.  CODE STATUS: fc  TOTAL TIME TAKING CARE OF THIS PATIENT: 35  minutes.   POSSIBLE D/C IN 2-3  DAYS, DEPENDING ON CLINICAL CONDITION.   Ramonita Lab M.D on 07/10/2015 at 1:45 PM  Between 7am to 6pm - Pager - [phone] After 6pm go to [email or web address] - password [email or web address] [facility]  [place] [facility]  Office  [phone]  CC: Primary care physician; Derwood Kaplan, MD

## 2015-07-10 NOTE — Progress Notes (Signed)
Called and spoke with daughter Terry Perry about pt medication nevirapine that pharmacist Susy FrizzleMatt reported we do not have in house. Daughter report she will go and obtain medication from facility, about 4 days supply. Gave daughter update on pt as well. No other signs of distress noted. Will continue to monitor.

## 2015-07-11 LAB — GLUCOSE, CAPILLARY
GLUCOSE-CAPILLARY: 62 mg/dL — AB (ref 65–99)
GLUCOSE-CAPILLARY: 134 mg/dL — AB (ref 65–99)
Glucose-Capillary: 72 mg/dL (ref 65–99)
Glucose-Capillary: 81 mg/dL (ref 65–99)
Glucose-Capillary: 112 mg/dL — ABNORMAL HIGH (ref 65–99)

## 2015-07-11 MED ORDER — INSULIN GLARGINE 100 UNIT/ML ~~LOC~~ SOLN
15.0000 [IU] | Freq: Every day | SUBCUTANEOUS | Status: DC
  Administered 2015-07-11: 23:00:00 15 [IU] via SUBCUTANEOUS
  Filled 2015-07-11 (×2): qty 0.15

## 2015-07-11 MED ORDER — AZITHROMYCIN 250 MG PO TABS
250.0000 mg | ORAL_TABLET | ORAL | Status: DC
  Administered 2015-07-11: 250 mg via ORAL
  Filled 2015-07-11: qty 1

## 2015-07-11 MED ORDER — INSULIN ASPART 100 UNIT/ML ~~LOC~~ SOLN: 0.0000 [IU] | Freq: Every day | SUBCUTANEOUS | Status: DC

## 2015-07-11 MED ORDER — INSULIN ASPART 100 UNIT/ML ~~LOC~~ SOLN
0.0000 [IU] | Freq: Three times a day (TID) | SUBCUTANEOUS | Status: DC
  Administered 2015-07-12 (×2): 1 [IU] via SUBCUTANEOUS
  Filled 2015-07-11 (×2): qty 1

## 2015-07-11 MED ORDER — ENOXAPARIN SODIUM 40 MG/0.4ML ~~LOC~~ SOLN
40.0000 mg | SUBCUTANEOUS | Status: DC
  Administered 2015-07-11: 17:00:00 40 mg via SUBCUTANEOUS
  Filled 2015-07-11: qty 0.4

## 2015-07-11 NOTE — Progress Notes (Signed)
Inpatient Diabetes Program Recommendations  AACE/ADA: New Consensus Statement on Inpatient Glycemic Control (2015)  Target Ranges:  Prepandial:   less than 140 mg/dL      Peak postprandial:   less than 180 mg/dL (1-2 hours)      Critically ill patients:  140 - 180 mg/dL  Results for Terry Perry, Terry Perry (MRN [id number]) as of 07/11/2015 10:48  Ref. Range 07/10/2015 06:08 07/10/2015 11:26 07/10/2015 16:11 07/10/2015 22:15 07/11/2015 06:12 07/11/2015 06:31 07/11/2015 06:48  Glucose-Capillary Latest Ref Range: 65-99 mg/dL 244149 (H) 010117 (H) 82 272107 (H) 62 (L) 72 81   Review of Glycemic Control  Diabetes history: DM2 Outpatient Diabetes medications: Lantus 20 units QHS, Glipizide 5 mg BID Current orders for Inpatient glycemic control: Lantus 20 units QHS  Inpatient Diabetes Program Recommendations: Insulin - Basal: Noted fasting glucose 62 mg/dl at 5:366:12 am today. May want to consider decreasing Lantus to 15 units QHS. Insulin - Meal Coverage: While inpatient, please consider ordering CBGs with Novolog sensitive ACHS.  Thanks, Orlando PennerMarie Brina Umeda, RN, MSN, CDE Diabetes Coordinator Inpatient Diabetes Program [phone] (Team Pager from 8am to 5pm) [phone] ([facility] office) [phone] [facility] office) [phone] [facility] office)

## 2015-07-11 NOTE — Care Management Important Message (Signed)
Important Message  Patient Details  Name: Terry Perry MRN: [id number] Date of Birth: Feb 27, 1923   Medicare Important Message Given:  Yes    Olegario MessierKathy A Mihailo Sage 07/11/2015, 10:19 AM

## 2015-07-11 NOTE — Progress Notes (Signed)
[facility]   PATIENT NAME: Terry Perry    MR#:  [id number]  DATE OF BIRTH:  03/09/1923  SUBJECTIVE:  CHIEF COMPLAINT:  Patient is more awake and alert.feeding himself with left hand. 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 No family members at bedside  REVIEW OF SYSTEMS:  CONSTITUTIONAL: No fever, fatigue or weakness.  EYES: No blurred or double vision.  EARS, NOSE, AND THROAT: No tinnitus or ear pain.  RESPIRATORY: Minimal cough, denies shortness of breath, wheezing or hemoptysis.  CARDIOVASCULAR: No chest pain, orthopnea, edema.  GASTROINTESTINAL: No nausea, vomiting, diarrhea or abdominal pain.  GENITOURINARY: No dysuria, hematuria.  ENDOCRINE: No polyuria, nocturia,  HEMATOLOGY: No anemia, easy bruising or bleeding SKIN: No rash or lesion. MUSCULOSKELETAL: No joint pain or arthritis.   NEUROLOGIC: No tingling, numbness, weakness. Chronic right-sided weakness from old CVA and bedbound  PSYCHIATRY: No anxiety or depression.   DRUG ALLERGIES:   Allergies  Allergen Reactions  . Aricept [Donepezil Hcl] Other (See Comments)    Unknown reaction    VITALS:  Blood pressure 113/61, pulse 60, temperature 99.4 F (37.4 C), temperature source Oral, resp. rate 18, height 5\' 7"  (1.702 m), weight 91.173 kg (201 lb), SpO2 92 %.  PHYSICAL EXAMINATION:  GENERAL:  82 y.o.-year-old patient lying in the bed with no acute distress.  EYES: Pupils equal, round, reactive to light and accommodation. No scleral icterus. Extraocular muscles intact.  HEENT: Head atraumatic, normocephalic. Oropharynx and nasopharynx clear.  NECK:  Supple, no jugular venous distention. No thyroid enlargement, no tenderness.  LUNGS: Normal breath sounds bilaterally, no wheezing, rales,rhonchi or crepitation. No use of accessory muscles of respiration.  CARDIOVASCULAR: S1, S2 normal. No  murmurs, rubs, or gallops.  ABDOMEN: Soft, nontender, nondistended. Bowel sounds present. No organomegaly or mass.  EXTREMITIES: No pedal edema, cyanosis, or clubbing.  NEUROLOGIC: chronic right-sided weakness from old CVA  . Sensation intact. Gait not checked.  PSYCHIATRIC: The patient is alert and oriented x 2-3.  SKIN: No obvious rash, lesion, or ulcer.    LABORATORY PANEL:   CBC  Recent Labs Lab 07/10/15 0534  WBC 6.4  HGB 12.4*  HCT 35.9*  PLT 105*   ------------------------------------------------------------------------------------------------------------------  Chemistries   Recent Labs Lab 07/09/15 1602 07/10/15 0534  NA 141 139  K 4.2 3.8  CL 104 108  CO2 27 26  GLUCOSE 226* 163*  BUN 29* 30*  CREATININE 1.70* 1.40*  CALCIUM 9.1 8.3*  AST 78*  --   ALT 27  --   ALKPHOS 54  --   BILITOT 1.1  --    ------------------------------------------------------------------------------------------------------------------  Cardiac Enzymes  Recent Labs Lab 07/09/15 1602  TROPONINI <0.03   ------------------------------------------------------------------------------------------------------------------  RADIOLOGY:  Dg Chest 2 View  07/10/2015  CLINICAL DATA:  RIGHT-side drooping, unconscious, poor historian, former smoker, diabetes mellitus, hypertension, HIV, former smoker EXAM: CHEST  2 VIEW COMPARISON:  07/09/2015 FINDINGS: Upper normal heart size. Tortuous aorta. Rotated to the RIGHT with slight prominence of the RIGHT superior mediastinum unchanged. Bibasilar atelectasis. Bronchitic changes and questionable underlying emphysematous changes. Upper lungs clear. Tiny LEFT pleural effusion. No pneumothorax. BILATERAL glenohumeral degenerative changes. Diffuse osseous demineralization. IMPRESSION: Bronchitic and question emphysematous changes with bibasilar atelectasis and tiny LEFT pleural effusion. Electronically Signed   By: Ulyses Southward M.D.   On: 07/10/2015 11:39    Dg Chest Portable 1 View  07/09/2015  CLINICAL DATA:  Right-sided facial droop today resolved. EXAM: PORTABLE CHEST 1 VIEW COMPARISON:  06/11/2009 FINDINGS: Lungs are somewhat hypoinflated with bibasilar opacification left worse than right which may be due to atelectasis or infection. Cannot exclude a small amount of bilateral pleural fluid. Borderline cardiomegaly. Mild calcified plaque over the aortic arch. Degenerative changes of the shoulders. IMPRESSION: Hypoinflation with bibasilar opacification which may be due to atelectasis or infection. Cannot exclude a small amount of bilateral pleural fluid. Electronically Signed   By: Elberta Fortisaniel  Boyle M.D.   On: 07/09/2015 16:32    EKG:   Orders placed or performed during the hospital encounter of 07/09/15  . ED EKG  . ED EKG  . EKG 12-Lead  . EKG 12-Lead    ASSESSMENT AND PLAN:   * AMS likely metabolic encephalopathy secondary to acute bronchitis and acute sinusitis  Mentating better today Continue antibiotics  * Acute bronchitis and acute sinusitis We'll continue IV Rocephin and azithromycin for this point and check his sputum culture. Repeat chest x-ray two-view today has revealed bronchitic changes and emphysematous changes   * Acute kidney injury probably from dehydration Continue IV fluids Creatinine is improving-1.70-1.40  * HIV Continue the medication Descovy and viramune  he was taking at nursing home.  * Diabetes on insulin sliding scale coverage.  * Hypertension Hold  the medication his blood pressure is running on lower normal side.     All the records are reviewed and case discussed with Care Management/Social Workerr. Management plans discussed with the patient, family and they are in agreement.  CODE STATUS: fc  TOTAL TIME TAKING CARE OF THIS PATIENT: 35  minutes.   POSSIBLE D/C IN 1-2 DAYS, DEPENDING ON CLINICAL CONDITION.   Ramonita LabGouru, Meadow Abramo M.D on 07/11/2015 at 3:57 PM  Between 7am to 6pm - Pager  - [phone] After 6pm go to [email or web address] - password [email or web address] [facility]  [facility]  Office  [phone]  CC: Primary care physician; Derwood KaplanEason,  Ernest B, MD

## 2015-07-11 NOTE — NC FL2 (Signed)
MEDICAID FL2 LEVEL OF CARE SCREENING TOOL     IDENTIFICATION  Patient Name: Terry Perry Birthdate: 05-06-1923 Sex: male Admission Date (Current Location): 07/09/2015  [facility] [place]  [place]  ([id number]) Facility and Address:  [facility], [address], [place], [place] [address]      Provider Number: [id number]  Attending Physician Name and Address:  Ramonita LabAruna Gouru, MD  Relative Name and Phone Number:       Current Level of Care: Hospital Recommended Level of Care: Skilled Nursing Facility Prior Approval Number:    Date Approved/Denied:   PASRR Number:  [id number] [id number])  Discharge Plan: SNF    Current Diagnoses: Patient Active Problem List   Diagnosis Date Noted  . Pneumonia 07/09/2015    Orientation RESPIRATION BLADDER Height & Weight    Self  Normal Incontinent 5\' 7"  (170.2 cm) 201 lbs.  BEHAVIORAL SYMPTOMS/MOOD NEUROLOGICAL BOWEL NUTRITION STATUS   (None )  (None ) Continent Diet (heart healthy/carb modified )  AMBULATORY STATUS COMMUNICATION OF NEEDS Skin   Extensive Assist Verbally Normal                       Personal Care Assistance Level of Assistance  Bathing, Feeding, Dressing Bathing Assistance: Limited assistance Feeding assistance: Limited assistance Dressing Assistance: Limited assistance     Functional Limitations Info  Sight, Hearing, Speech Sight Info: Adequate Hearing Info: Adequate Speech Info: Adequate    SPECIAL CARE FACTORS FREQUENCY                       Contractures      Additional Factors Info  Allergies, Code Status, Insulin Sliding Scale Code Status Info:  (Full Code ) Allergies Info:  (Aricept)   Insulin Sliding Scale Info:  (insulin aspart (novoLOG) injection 0-5 Units- 3 times a day with each meal )       Current Medications (07/11/2015):  This is the current hospital active medication list Current Facility-Administered Medications  Medication Dose  Route Frequency Provider Last Rate Last Dose  . 0.9 %  sodium chloride infusion   Intravenous Continuous Altamese DillingVaibhavkumar Vachhani, MD 75 mL/hr at 07/11/15 1634    . aspirin EC tablet 81 mg  81 mg Oral Daily Cindi CarbonMary M Swayne, RPH   81 mg at 07/11/15 1012  . azithromycin (ZITHROMAX) tablet 250 mg  250 mg Oral Q24H Ramonita LabAruna Gouru, MD   250 mg at 07/11/15 1638  . cefTRIAXone (ROCEPHIN) 1 g in dextrose 5 % 50 mL IVPB  1 g Intravenous Q24H Altamese DillingVaibhavkumar Vachhani, MD 100 mL/hr at 07/10/15 1812 1 g at 07/10/15 1812  . divalproex (DEPAKOTE) DR tablet 250 mg  250 mg Oral BID Altamese DillingVaibhavkumar Vachhani, MD   250 mg at 07/11/15 1012  . docusate sodium (COLACE) capsule 100 mg  100 mg Oral Daily Altamese DillingVaibhavkumar Vachhani, MD   100 mg at 07/11/15 1012  . dorzolamide-timolol (COSOPT) 22.3-6.8 MG/ML ophthalmic solution 1 drop  1 drop Both Eyes BID Altamese DillingVaibhavkumar Vachhani, MD   1 drop at 07/11/15 1013  . emtricitabine-tenofovir AF (DESCOVY) 200-25 MG per tablet 1 tablet  1 tablet Oral Daily Cindi CarbonMary M Swayne, [facility]   1 tablet at 07/11/15 1054   And  . nevirapine [facility](VIRAMUNE) tablet 200 mg  200 mg Oral Daily Cindi CarbonMary M Swayne, RPH   200 mg at 07/11/15 1013  . enoxaparin (LOVENOX) injection 40 mg  40 mg Subcutaneous Q24H Ramonita LabAruna Gouru, MD   40  mg at 07/11/15 1634  . insulin aspart (novoLOG) injection 0-5 Units  0-5 Units Subcutaneous QHS Aruna Gouru, MD      . insulin aspart (novoLOG) injection 0-9 Units  0-9 Units Subcutaneous TID WC Ramonita Lab, MD   0 Units at 07/11/15 1700  . insulin glargine (LANTUS) injection 15 Units  15 Units Subcutaneous QHS Aruna Gouru, MD      . latanoprost (XALATAN) 0.005 % ophthalmic solution 1 drop  1 drop Left Eye QHS Altamese Dilling, MD   1 drop at 07/10/15 2009  . memantine (NAMENDA XR) 24 hr capsule 28 mg  28 mg Oral Daily Altamese Dilling, MD   28 mg at 07/11/15 1012  . multivitamin with minerals tablet 1 tablet  1 tablet Oral Daily Altamese Dilling, MD   1 tablet at 07/11/15 1012  . ondansetron  (ZOFRAN) tablet 4 mg  4 mg Oral Q4H PRN Altamese Dilling, MD      . pantoprazole (PROTONIX) EC tablet 40 mg  40 mg Oral Daily Altamese Dilling, MD   40 mg at 07/11/15 1012  . sulindac (CLINORIL) tablet 100 mg  100 mg Oral BID Altamese Dilling, MD   100 mg at 07/11/15 1013     Discharge Medications: Please see discharge summary for a list of discharge medications.  Relevant Imaging Results:  Relevant Lab Results:   Additional Information  (SSN [id number])  Verta Ellen Sunkins, LCSW

## 2015-07-12 LAB — GLUCOSE, CAPILLARY
GLUCOSE-CAPILLARY: 66 mg/dL (ref 65–99)
GLUCOSE-CAPILLARY: 69 mg/dL (ref 65–99)
GLUCOSE-CAPILLARY: 92 mg/dL (ref 65–99)
GLUCOSE-CAPILLARY: 129 mg/dL — AB (ref 65–99)
Glucose-Capillary: 145 mg/dL — ABNORMAL HIGH (ref 65–99)
Glucose-Capillary: 112 mg/dL — ABNORMAL HIGH (ref 65–99)
Glucose-Capillary: 127 mg/dL — ABNORMAL HIGH (ref 65–99)

## 2015-07-12 LAB — HEMOGLOBIN A1C: HEMOGLOBIN A1C: 7.6 % — AB (ref 4.0–6.0)

## 2015-07-12 MED ORDER — INSULIN GLARGINE 100 UNITS/ML SOLOSTAR PEN: 14.0000 [IU] | PEN_INJECTOR | Freq: Every day | SUBCUTANEOUS | Status: AC

## 2015-07-12 MED ORDER — PREDNISONE 50 MG PO TABS
50.0000 mg | ORAL_TABLET | Freq: Once | ORAL | Status: AC
  Administered 2015-07-12: 12:00:00 50 mg via ORAL
  Filled 2015-07-12: qty 1

## 2015-07-12 MED ORDER — PREDNISONE 50 MG PO TABS: 50.0000 mg | ORAL_TABLET | Freq: Every day | ORAL | Status: AC

## 2015-07-12 MED ORDER — ALBUTEROL SULFATE HFA 108 (90 BASE) MCG/ACT IN AERS: 2.0000 | INHALATION_SPRAY | Freq: Four times a day (QID) | RESPIRATORY_TRACT | Status: AC | PRN

## 2015-07-12 MED ORDER — FLEET ENEMA 7-19 GM/118ML RE ENEM
1.0000 | ENEMA | Freq: Once | RECTAL | Status: AC
  Administered 2015-07-12: 14:00:00 1 via RECTAL

## 2015-07-12 MED ORDER — LEVOFLOXACIN 250 MG PO TABS: 250.0000 mg | ORAL_TABLET | Freq: Every day | ORAL | Status: AC

## 2015-07-12 NOTE — Progress Notes (Signed)
Inpatient Diabetes Program Recommendations  AACE/ADA: New Consensus Statement on Inpatient Glycemic Control (2015)  Target Ranges:  Prepandial:   less than 140 mg/dL      Peak postprandial:   less than 180 mg/dL (1-2 hours)      Critically ill patients:  140 - 180 mg/dL  Results for CHEIKH, BRAMBLE (MRN [id number]) as of 07/12/2015 08:46  Ref. Range 07/11/2015 06:12 07/11/2015 06:31 07/11/2015 06:48 07/11/2015 16:42 07/11/2015 22:07 07/12/2015 07:34 07/12/2015 08:09 07/12/2015 08:37  Glucose-Capillary Latest Ref Range: 65-99 mg/dL 62 (L) 72 81 409 (H) 811 (H) 69 66 92   Review of Glycemic Control  Diabetes history: DM2 Outpatient Diabetes medications: Lantus 20 units QHS, Glipizide 5 mg BID Current orders for Inpatient glycemic control: Lantus 15 units QHS, Novolog 0-9 units TID with meals, Novolog 0-5 units HS  Inpatient Diabetes Program Recommendations: Insulin - Basal: Lantus was decreased from 20 units to 15 units yesterday and patient received Lantus 15 units last night. Noted fasting glucose 66 mg/dl this morning. Please consider decreasing Lantus to 12 units QHS.  Thanks, Orlando Penner, RN, MSN, CDE Diabetes Coordinator Inpatient Diabetes Program [phone] (Team Pager from 8am to 5pm) [phone] ([facility] office) [phone] [facility] office) [phone] [facility] office)

## 2015-07-12 NOTE — Clinical Social Work Note (Signed)
Clinical Social Work Assessment  Patient Details  Name: Terry Perry MRN: [id number] Date of Birth: [birth year]  Date of referral:  07/12/15               Reason for consult:  Facility Placement                Permission sought to share information with:  Family Supports Permission granted to share information::  Yes, Verbal Permission Granted  Name::     Terry Perry, daughter   Housing/Transportation Living arrangements for the past 2 months:  [place] of Information:  Patient, Adult Children Patient Interpreter Needed:  None Criminal Activity/Legal Involvement Pertinent to Current Situation/Hospitalization:  No - Comment as needed Significant Relationships:  Adult Children Lives with:  Facility Resident Do you feel safe going back to the place where you live?  Yes Need for family participation in patient care:  Yes (Comment)  Care giving concerns:  No care giving concerns identified.   Social Worker assessment / plan:  CSW met with pt and daughter to address consult. CSW introduced herself and explained role of social work. CSW also explained the process of returning to SNF. Pt is a LTC [facility] at [facility]. Pt has been at [facility] since March of 2016. Per facility, pt is able to return. Per MD, pt is ready for discharge today. Pt will have Palliative Care NP following at facility, as well as possible PT. CSW send all discharge information to facility. Facility is ready to accept pt. RN to call report and EMS will provide transportation. CSW is signing off as no further needs identified.   Employment status:  Retired Programmer, applications and Medicaid [place] PT Recommendations:  Not assessed at this time Information / Referral to community resources:  Other (Comment Required)  Patient/Family's Response to care:  Pt and daughter were appreciative of CSW support.   Patient/Family's Understanding of and Emotional Response to  Diagnosis, Current Treatment, and Prognosis:  Pt and daughter understand and are agreeable to discharge plan. Per daughter, pt may benefit from PT at facility.   Emotional Assessment Appearance:  Appears stated age Attitude/Demeanor/Rapport:   (Appropriate) Affect (typically observed):  Accepting, Pleasant Orientation:  Oriented to Self, Oriented to Place Alcohol / Substance use:  Never Used Psych involvement (Current and /or in the community):  No (Comment)  Discharge Needs  Concerns to be addressed:  No discharge needs identified Readmission within the last 30 days:  No Current discharge risk:  Chronically ill Barriers to Discharge:  No Barriers Identified   Darden Dates, LCSW 07/12/2015, 2:03 PM

## 2015-07-12 NOTE — Discharge Summary (Signed)
[facility] - [place] at [facility]   PATIENT NAME: Terry Perry    MR#:  [id number]  DATE OF BIRTH:  [birth year]  DATE OF ADMISSION:  07/09/2015 ADMITTING PHYSICIAN: Altamese Dilling, MD  DATE OF DISCHARGE: No discharge date for patient encounter.  PRIMARY CARE PHYSICIAN: Derwood Kaplan, MD    ADMISSION DIAGNOSIS:  Somnolence [R40.0] Pneumonia [J18.9] Healthcare-associated pneumonia [J18.9] Elevated lactic acid level [E87.2] Acute maxillary sinusitis, recurrence not specified [J01.00]  DISCHARGE DIAGNOSIS:  Principal Problem:   Pneumonia   SECONDARY DIAGNOSIS:   Past Medical History  Diagnosis Date  . Diabetes mellitus without complication (HCC)   . Hypertension   . Dementia   . Arthritis   . HIV (human immunodeficiency virus infection) (HCC)      ADMITTING HISTORY  HISTORY OF PRESENT ILLNESS: Terry Perry is a 82 y.o. male with a known history of diabetes, hypertension, dementia, HIV, arthritis- lives at nursing home and wheelchair-bound, was seen completely at his baseline 2 days ago by daughter at nursing home. Today she got a call from the nursing home that he is drowsy and so they have to transfer him to emergency room. Daughter present in the room in ER and she agrees that her father looks more drowsy and lethargic today. She denies any complaints by him. In workup he was noted to have slightly elevated lactic acid and borderline low blood pressure. Chest x-ray was having some questionable infiltrate or atelectasis, and there is no other source of infection. She is given his admission to hospitalist team for possible pneumonia.   HOSPITAL COURSE:   * AMS likely metabolic encephalopathy secondary to acute bronchitis and acute sinusitis  Mentating better today Continue antibiotics  * Acute bronchitis and Left lower lobe pneumonia ON IV Rocephin and azithromycin . Change to PO levaquin at discharge for 1 week. Add  presdnisone   * Acute kidney injury probably from dehydration Continue IV fluids Creatinine is improving-1.70-1.40  * HIV Continue the medication Descovy and viramune he was taking at nursing home.  * Diabetes on insulin sliding scale coverage. Reduce levemir 20 --> 14 units due to poor appetite  * Hypertension continue meds.  Stable for discharge back to SNF  Discussed with daughter at bedside regarding CODE STATUS. Would like to discuss with sister and patient when improved regarding this    CONSULTS OBTAINED:     DRUG ALLERGIES:   Allergies  Allergen Reactions  . Aricept [Donepezil Hcl] Other (See Comments)    Unknown reaction    DISCHARGE MEDICATIONS:   Current Discharge Medication List    START taking these medications   Details  albuterol (PROVENTIL HFA;VENTOLIN HFA) 108 (90 Base) MCG/ACT inhaler Inhale 2 puffs into the lungs every 6 (six) hours as needed for wheezing or shortness of breath. Qty: 1 Inhaler, Refills: 0    levofloxacin (LEVAQUIN) 250 MG tablet Take 1 tablet (250 mg total) by mouth daily. Qty: 7 tablet, Refills: 0    predniSONE (DELTASONE) 50 MG tablet Take 1 tablet (50 mg total) by mouth daily with breakfast. Qty: 5 tablet, Refills: 0      CONTINUE these medications which have CHANGED   Details  insulin glargine (LANTUS) 100 unit/mL SOPN Inject 0.14 mLs (14 Units total) into the skin at bedtime. Qty: 15 mL, Refills: 11      CONTINUE these medications which have NOT CHANGED   Details  aspirin 81 MG tablet Take 81 mg by mouth daily.  divalproex (DEPAKOTE) 125 MG DR tablet Take 250 mg by mouth 2 (two) times daily.    docusate sodium (COLACE) 50 MG capsule Take 50 mg by mouth daily.    dorzolamide-timolol (COSOPT) 22.3-6.8 MG/ML ophthalmic solution Place 1 drop into both eyes 2 (two) times daily.    emtricitabine-tenofovir (TRUVADA) 200-300 MG tablet Take 1 tablet by mouth every other day.    glipiZIDE (GLUCOTROL) 5 MG tablet  Take 5 mg by mouth 2 (two) times daily.    latanoprost (XALATAN) 0.005 % ophthalmic solution Place 1 drop into the left eye at bedtime.    lisinopril (PRINIVIL,ZESTRIL) 20 MG tablet Take 20 mg by mouth daily.    memantine (NAMENDA XR) 28 MG CP24 24 hr capsule Take 28 mg by mouth daily.    Multiple Vitamins-Minerals (MULTIVITAMIN ADULTS 50+ PO) Take 1 tablet by mouth daily.    nevirapine (VIRAMUNE) 200 MG tablet Take 200 mg by mouth daily.    omeprazole (PRILOSEC OTC) 20 MG tablet Take 20 mg by mouth daily.    ondansetron (ZOFRAN) 4 MG tablet Take 4 mg by mouth every 4 (four) hours as needed for nausea or vomiting.    sulindac (CLINORIL) 200 MG tablet Take 100 mg by mouth 2 (two) times daily.         Today    VITAL SIGNS:  Blood pressure 143/79, pulse 55, temperature 98.1 F (36.7 C), temperature source Oral, resp. rate 18, height  (1.702 m), weight 91.173 kg (201 lb), SpO2 93 %.  I/O:   Intake/Output Summary (Last 24 hours) at 07/12/15 1113 Last data filed at 07/12/15 0900  Gross per 24 hour  Intake    360 ml  Output    450 ml  Net    -90 ml    PHYSICAL EXAMINATION:  Physical Exam  GENERAL:  82 y.o.-year-old patient lying in the bed with no acute distress.  LUNGS: Normal breath sounds bilaterally, no wheezing, rales,rhonchi or crepitation. No use of accessory muscles of respiration.  CARDIOVASCULAR: S1, S2 normal. No murmurs, rubs, or gallops.  ABDOMEN: Soft, non-tender, non-distended. Bowel sounds present. No organomegaly or mass.  NEUROLOGIC: Moves all 4 extremities. PSYCHIATRIC: The patient is alert and awake, chronic right weakness SKIN: No obvious rash, lesion, or ulcer.   DATA REVIEW:   CBC  Recent Labs Lab 07/10/15 0534  WBC 6.4  HGB 12.4*  HCT 35.9*  PLT 105*    Chemistries   Recent Labs Lab 07/09/15 1602 07/10/15 0534  NA 141 139  K 4.2 3.8  CL 104 108  CO2 27 26  GLUCOSE 226* 163*  BUN 29* 30*  CREATININE 1.70* 1.40*  CALCIUM  9.1 8.3*  AST 78*  --   ALT 27  --   ALKPHOS 54  --   BILITOT 1.1  --     Cardiac Enzymes  Recent Labs Lab 07/09/15 1602  TROPONINI <0.03    Microbiology Results  Results for orders placed or performed during the hospital encounter of 07/09/15  Culture, blood (routine x 2)     Status: None (Preliminary result)   Collection Time: 07/09/15  6:10 PM  Result Value Ref Range Status   Specimen Description BLOOD LEFT ANTECUBITAL  Final   Special Requests BOTTLES DRAWN AEROBIC AND ANAEROBIC  3CC  Final   Culture NO GROWTH 3 DAYS  Final   Report Status PENDING  Incomplete  Culture, blood (routine x 2)     Status: None (Preliminary result)   Collection  Time: 07/09/15  6:15 PM  Result Value Ref Range Status   Specimen Description BLOOD LEFT HAND  Final   Special Requests BOTTLES DRAWN AEROBIC AND ANAEROBIC  4CC  Final   Culture NO GROWTH 3 DAYS  Final   Report Status PENDING  Incomplete  MRSA PCR Screening     Status: None   Collection Time: 07/10/15  3:28 AM  Result Value Ref Range Status   MRSA by PCR NEGATIVE NEGATIVE Final    Comment:        The GeneXpert MRSA Assay (FDA approved for NASAL specimens only), is one component of a comprehensive MRSA colonization surveillance program. It is not intended to diagnose MRSA infection nor to guide or monitor treatment for MRSA infections.     RADIOLOGY:  No results found.    Follow up with PCP in 1 week.  Management plans discussed with the patient, family and they are in agreement.  CODE STATUS:     Code Status Orders        Start     Ordered   07/09/15 1844  Full code   Continuous     07/09/15 1843    Code Status History    Date Active Date Inactive Code Status Order ID Comments User Context   This patient has a current code status but no historical code status.    Advance Directive Documentation        Most Recent Value   Type of Advance Directive  Living will, Healthcare Power of Attorney   Pre-existing  out of facility DNR order (yellow form or pink MOST form)     "MOST" Form in Place?        TOTAL TIME TAKING CARE OF THIS PATIENT ON DAY OF DISCHARGE: more than 30 minutes.    Milagros Loll R M.D on 07/12/2015 at 11:13 AM  Between 7am to 6pm - Pager - [phone]  After 6pm go to [email or web address] - password [email or web address] [facility]  [place] [facility]  Office  [phone]  CC: Primary care physician; Derwood Kaplan, MD     Note: This dictation was prepared with Dragon dictation along with smaller phrase technology. Any transcriptional errors that result from this process are unintentional.

## 2015-07-12 NOTE — Progress Notes (Signed)
Patient family was concerned about small bruising on bilateral side of abdomin from Lovenox shots and bruise in left A/C from blood draw, told them it happens often with shots and blood draws had nursing supervisor talk to them and they were ok with everything

## 2015-07-12 NOTE — Progress Notes (Signed)
Spoke with [place], [facility] rep at [phone], to notify of non-emergent EMS transport.  Auth notification reference given as [id number].   Service date range good from 07/12/15 - 10/10/15.   Gap exception requested to determine if services can be considered at an in-network level.

## 2015-07-12 NOTE — Discharge Instructions (Addendum)
°  DIET:  Cardiac diet  DISCHARGE CONDITION:  Stable  ACTIVITY:  Activity as tolerated  OXYGEN:  Home Oxygen: No.   Oxygen Delivery: room air  DISCHARGE LOCATION:  SNF  If you experience worsening of your admission symptoms, develop shortness of breath, life threatening emergency, suicidal or homicidal thoughts you must seek medical attention immediately by calling 911 or calling your MD immediately  if symptoms less severe.  You Must read complete instructions/literature along with all the possible adverse reactions/side effects for all the Medicines you take and that have been prescribed to you. Take any new Medicines after you have completely understood and accpet all the possible adverse reactions/side effects.   Please note  You were cared for by a hospitalist during your hospital stay. If you have any questions about your discharge medications or the care you received while you were in the hospital after you are discharged, you can call the unit and asked to speak with the hospitalist on call if the hospitalist that took care of you is not available. Once you are discharged, your primary care physician will handle any further medical issues. Please note that NO REFILLS for any discharge medications will be authorized once you are discharged, as it is imperative that you return to your primary care physician (or establish a relationship with a primary care physician if you do not have one) for your aftercare needs so that they can reassess your need for medications and monitor your lab values.

## 2015-07-12 NOTE — Progress Notes (Signed)
Received MD order to discharge patient back to [facility] called report to Inis Sizer RN  At [facility].

## 2015-07-12 NOTE — Progress Notes (Signed)
Palliative Care Update  I was researching the record in preparation to see pt (for a consult placed yesterday), when it became apparent that he is in the process of being transferred back to the facility.   Family is going to think about 'code status' per one note.    Pt is of advanced age and I would be glad to see pt if he was going to stay.    Suan Halter, MD

## 2015-07-14 LAB — CULTURE, BLOOD (ROUTINE X 2)
CULTURE: NO GROWTH
Culture: NO GROWTH

## 2015-08-24 ENCOUNTER — Emergency Department: Payer: Non-veteran care

## 2015-08-24 ENCOUNTER — Inpatient Hospital Stay
Admission: EM | Admit: 2015-08-24 | Discharge: 2015-09-24 | DRG: 296 | Disposition: E | Payer: Non-veteran care | Attending: Internal Medicine | Admitting: Internal Medicine

## 2015-08-24 DIAGNOSIS — I469 Cardiac arrest, cause unspecified: Principal | ICD-10-CM | POA: Diagnosis present

## 2015-08-24 DIAGNOSIS — Z820 Family history of epilepsy and other diseases of the nervous system: Secondary | ICD-10-CM

## 2015-08-24 DIAGNOSIS — F0390 Unspecified dementia without behavioral disturbance: Secondary | ICD-10-CM | POA: Diagnosis present

## 2015-08-24 DIAGNOSIS — Z87891 Personal history of nicotine dependence: Secondary | ICD-10-CM

## 2015-08-24 DIAGNOSIS — M1990 Unspecified osteoarthritis, unspecified site: Secondary | ICD-10-CM | POA: Diagnosis present

## 2015-08-24 DIAGNOSIS — R319 Hematuria, unspecified: Secondary | ICD-10-CM | POA: Diagnosis present

## 2015-08-24 DIAGNOSIS — J9601 Acute respiratory failure with hypoxia: Secondary | ICD-10-CM | POA: Diagnosis present

## 2015-08-24 DIAGNOSIS — Z515 Encounter for palliative care: Secondary | ICD-10-CM | POA: Diagnosis present

## 2015-08-24 DIAGNOSIS — R6521 Severe sepsis with septic shock: Secondary | ICD-10-CM | POA: Diagnosis present

## 2015-08-24 DIAGNOSIS — N179 Acute kidney failure, unspecified: Secondary | ICD-10-CM | POA: Diagnosis present

## 2015-08-24 DIAGNOSIS — B20 Human immunodeficiency virus [HIV] disease: Secondary | ICD-10-CM | POA: Diagnosis present

## 2015-08-24 DIAGNOSIS — Z8701 Personal history of pneumonia (recurrent): Secondary | ICD-10-CM | POA: Diagnosis not present

## 2015-08-24 DIAGNOSIS — A419 Sepsis, unspecified organism: Secondary | ICD-10-CM | POA: Diagnosis present

## 2015-08-24 DIAGNOSIS — N170 Acute kidney failure with tubular necrosis: Secondary | ICD-10-CM | POA: Diagnosis present

## 2015-08-24 DIAGNOSIS — Z66 Do not resuscitate: Secondary | ICD-10-CM | POA: Diagnosis present

## 2015-08-24 DIAGNOSIS — Z888 Allergy status to other drugs, medicaments and biological substances status: Secondary | ICD-10-CM

## 2015-08-24 DIAGNOSIS — R748 Abnormal levels of other serum enzymes: Secondary | ICD-10-CM | POA: Diagnosis present

## 2015-08-24 DIAGNOSIS — Z8042 Family history of malignant neoplasm of prostate: Secondary | ICD-10-CM

## 2015-08-24 DIAGNOSIS — E1122 Type 2 diabetes mellitus with diabetic chronic kidney disease: Secondary | ICD-10-CM | POA: Diagnosis present

## 2015-08-24 DIAGNOSIS — N183 Chronic kidney disease, stage 3 (moderate): Secondary | ICD-10-CM | POA: Diagnosis present

## 2015-08-24 DIAGNOSIS — I1310 Hypertensive heart and chronic kidney disease without heart failure, with stage 1 through stage 4 chronic kidney disease, or unspecified chronic kidney disease: Secondary | ICD-10-CM | POA: Diagnosis present

## 2015-08-24 LAB — COMPREHENSIVE METABOLIC PANEL
ALT: 35 U/L (ref 17–63)
AST: 164 U/L — AB (ref 15–41)
Albumin: 2 g/dL — ABNORMAL LOW (ref 3.5–5.0)
Alkaline Phosphatase: 205 U/L — ABNORMAL HIGH (ref 38–126)
Anion gap: 14 (ref 5–15)
BILIRUBIN TOTAL: 2.2 mg/dL — AB (ref 0.3–1.2)
BUN: 27 mg/dL — AB (ref 6–20)
CO2: 12 mmol/L — ABNORMAL LOW (ref 22–32)
Calcium: 7.7 mg/dL — ABNORMAL LOW (ref 8.9–10.3)
Chloride: 118 mmol/L — ABNORMAL HIGH (ref 101–111)
Creatinine, Ser: 2.6 mg/dL — ABNORMAL HIGH (ref 0.61–1.24)
GFR, EST AFRICAN AMERICAN: 23 mL/min — AB (ref >60)
GFR, EST NON AFRICAN AMERICAN: 20 mL/min — AB (ref >60)
Glucose, Bld: 100 mg/dL — ABNORMAL HIGH (ref 65–99)
POTASSIUM: 4.5 mmol/L (ref 3.5–5.1)
Sodium: 144 mmol/L (ref 135–145)
TOTAL PROTEIN: 5.6 g/dL — AB (ref 6.5–8.1)

## 2015-08-24 LAB — AMMONIA: Ammonia: 44 umol/L — ABNORMAL HIGH (ref 9–35)

## 2015-08-24 LAB — CBC WITH DIFFERENTIAL/PLATELET
Basophils Absolute: 0.1 10*3/uL (ref 0–0.1)
Basophils Relative: 1 %
EOS PCT: 0 %
Eosinophils Absolute: 0.1 10*3/uL (ref 0–0.7)
HEMATOCRIT: 35.9 % — AB (ref 40.0–52.0)
Hemoglobin: 11.2 g/dL — ABNORMAL LOW (ref 13.0–18.0)
LYMPHS ABS: 4 10*3/uL — AB (ref 1.0–3.6)
LYMPHS PCT: 17 %
MCH: 32.7 pg (ref 26.0–34.0)
MCHC: 31.3 g/dL — AB (ref 32.0–36.0)
MCV: 104.5 fL — AB (ref 80.0–100.0)
MONO ABS: 0.7 10*3/uL (ref 0.2–1.0)
MONOS PCT: 3 %
NEUTROS ABS: 18.4 10*3/uL — AB (ref 1.4–6.5)
Neutrophils Relative %: 79 %
Platelets: 119 10*3/uL — ABNORMAL LOW (ref 150–440)
RBC: 3.43 MIL/uL — ABNORMAL LOW (ref 4.40–5.90)
RDW: 16.3 % — AB (ref 11.5–14.5)
WBC: 23.4 10*3/uL — ABNORMAL HIGH (ref 3.8–10.6)

## 2015-08-24 LAB — GLUCOSE, CAPILLARY
GLUCOSE-CAPILLARY: 113 mg/dL — AB (ref 65–99)
GLUCOSE-CAPILLARY: 89 mg/dL (ref 65–99)

## 2015-08-24 LAB — TROPONIN I: TROPONIN I: 0.29 ng/mL — AB (ref <0.031)

## 2015-08-24 LAB — LIPASE, BLOOD: LIPASE: 22 U/L (ref 11–51)

## 2015-08-24 LAB — LACTIC ACID, PLASMA: LACTIC ACID, VENOUS: 3.2 mmol/L — AB (ref 0.5–2.0)

## 2015-08-24 MED ORDER — EPINEPHRINE HCL 0.1 MG/ML IJ SOSY
PREFILLED_SYRINGE | INTRAMUSCULAR | Status: AC | PRN
  Administered 2015-08-24 (×2): 1 via INTRAVENOUS
  Administered 2015-08-24: 0.1 mg via INTRAVENOUS
  Administered 2015-08-24: 15:00:00 via INTRAVENOUS
  Administered 2015-08-24: 0.1 mg via INTRAVENOUS
  Administered 2015-08-24: 1 via INTRAVENOUS

## 2015-08-24 MED ORDER — SODIUM CHLORIDE 0.9 % IV BOLUS (SEPSIS)
1000.0000 mL | Freq: Once | INTRAVENOUS | Status: AC
  Administered 2015-08-24: 1000 mL via INTRAVENOUS

## 2015-08-24 MED ORDER — PIPERACILLIN-TAZOBACTAM 3.375 G IVPB
3.3750 g | Freq: Once | INTRAVENOUS | Status: AC
  Administered 2015-08-24: 3.375 g via INTRAVENOUS
  Filled 2015-08-24: qty 50

## 2015-08-24 MED ORDER — SODIUM CHLORIDE 0.9 % IV SOLN: INTRAVENOUS | Status: DC

## 2015-08-24 MED ORDER — SODIUM CHLORIDE 0.9 % IV BOLUS (SEPSIS): 1000.0000 mL | Freq: Once | INTRAVENOUS | Status: DC

## 2015-08-24 MED ORDER — EPINEPHRINE HCL 1 MG/ML IJ SOLN
0.5000 ug/min | INTRAVENOUS | Status: DC
  Administered 2015-08-24: 0.5 ug/min via INTRAVENOUS
  Filled 2015-08-24: qty 4

## 2015-08-24 MED ORDER — IPRATROPIUM-ALBUTEROL 0.5-2.5 (3) MG/3ML IN SOLN: 3.0000 mL | Freq: Four times a day (QID) | RESPIRATORY_TRACT | Status: DC | PRN

## 2015-08-24 MED ORDER — VANCOMYCIN HCL IN DEXTROSE 1-5 GM/200ML-% IV SOLN
1000.0000 mg | INTRAVENOUS | Status: AC
  Administered 2015-08-24: 1000 mg via INTRAVENOUS
  Filled 2015-08-24: qty 200

## 2015-08-24 MED ORDER — DOPAMINE-DEXTROSE 3.2-5 MG/ML-% IV SOLN
INTRAVENOUS | Status: AC | PRN
  Administered 2015-08-24: 10 ug/kg/min via INTRAVENOUS

## 2015-08-24 MED ORDER — ONDANSETRON HCL 4 MG/2ML IJ SOLN: 4.0000 mg | Freq: Four times a day (QID) | INTRAMUSCULAR | Status: DC | PRN

## 2015-08-24 MED ORDER — SODIUM CHLORIDE 0.9% FLUSH: 3.0000 mL | Freq: Two times a day (BID) | INTRAVENOUS | Status: DC

## 2015-08-24 MED ORDER — DOPAMINE-DEXTROSE 3.2-5 MG/ML-% IV SOLN: 0.0000 ug/kg/min | Freq: Once | INTRAVENOUS | Status: DC

## 2015-08-24 MED ORDER — ONDANSETRON HCL 4 MG PO TABS: 4.0000 mg | ORAL_TABLET | Freq: Four times a day (QID) | ORAL | Status: DC | PRN

## 2015-08-24 MED ORDER — MORPHINE SULFATE (PF) 4 MG/ML IV SOLN: 4.0000 mg | INTRAVENOUS | Status: DC | PRN

## 2015-08-24 MED ORDER — IPRATROPIUM-ALBUTEROL 20-100 MCG/ACT IN AERS: 1.0000 | INHALATION_SPRAY | Freq: Four times a day (QID) | RESPIRATORY_TRACT | Status: DC | PRN

## 2015-08-25 DIAGNOSIS — R6521 Severe sepsis with septic shock: Secondary | ICD-10-CM

## 2015-08-25 DIAGNOSIS — A419 Sepsis, unspecified organism: Secondary | ICD-10-CM | POA: Diagnosis present

## 2015-08-25 DIAGNOSIS — N179 Acute kidney failure, unspecified: Secondary | ICD-10-CM | POA: Diagnosis present

## 2015-08-25 LAB — BLOOD CULTURE ID PANEL (REFLEXED)
Acinetobacter baumannii: NOT DETECTED
CANDIDA GLABRATA: NOT DETECTED
CANDIDA KRUSEI: NOT DETECTED
CANDIDA PARAPSILOSIS: NOT DETECTED
CARBAPENEM RESISTANCE: NOT DETECTED
Candida albicans: NOT DETECTED
Candida tropicalis: NOT DETECTED
ESCHERICHIA COLI: DETECTED — AB
Enterobacter cloacae complex: NOT DETECTED
Enterobacteriaceae species: DETECTED — AB
Enterococcus species: NOT DETECTED
Haemophilus influenzae: NOT DETECTED
KLEBSIELLA OXYTOCA: NOT DETECTED
KLEBSIELLA PNEUMONIAE: NOT DETECTED
LISTERIA MONOCYTOGENES: NOT DETECTED
Methicillin resistance: NOT DETECTED
Neisseria meningitidis: NOT DETECTED
Proteus species: NOT DETECTED
Pseudomonas aeruginosa: NOT DETECTED
SERRATIA MARCESCENS: NOT DETECTED
STAPHYLOCOCCUS SPECIES: NOT DETECTED
STREPTOCOCCUS AGALACTIAE: NOT DETECTED
STREPTOCOCCUS SPECIES: NOT DETECTED
Staphylococcus aureus (BCID): NOT DETECTED
Streptococcus pneumoniae: NOT DETECTED
Streptococcus pyogenes: NOT DETECTED
Vancomycin resistance: NOT DETECTED

## 2015-08-28 LAB — CULTURE, BLOOD (ROUTINE X 2)

## 2015-08-29 LAB — CULTURE, BLOOD (ROUTINE X 2): Culture: NO GROWTH

## 2015-08-30 MED FILL — Medication: Qty: 1 | Status: AC

## 2015-09-24 NOTE — Code Documentation (Signed)
Pulse check, strong femoral pulse palpated

## 2015-09-24 NOTE — H&P (Signed)
[facility] Physicians - [place] at [facility]   PATIENT NAME: Terry Perry    MR#:  [id number]  DATE OF BIRTH:  April 08, 1923  DATE OF ADMISSION:  19-Sep-2015  PRIMARY CARE PHYSICIAN: Derwood Kaplan, MD   REQUESTING/REFERRING PHYSICIAN: Dr. Currie Paris  CHIEF COMPLAINT:   Chief Complaint  Patient presents with  . Cardiac Arrest    HISTORY OF PRESENT ILLNESS:  Terry Perry  is a 82 y.o. male with a known history of diabetes, hypertension, dementia, HIV who was recently in the hospital for pneumonia returns to the emergency room due to cardiac arrest. History obtained from reviewing old records and daughter at bedside and discussing with ED physician and staff. Patient is presently intubated and unresponsive. Patient had a witnessed cardiac arrest at daughter's home and CPR was started by the daughter and the caretaker. When EMS arrived patient had asystole CPR continued for 20 minutes with 4-5 rounds of epinephrine. On arrival to emergency room patient was found to be in PEA and further CPR was conducted. Now he is on 2 pressors with epinephrine and dopamine. Off sedation without any response. Breathing over the vent. CT scan of the head showed nothing acute. Elevated WBC count of 23,000.  Daughter mentions that patient improved with his pneumonia and bronchitis well and returned to his baseline. 2 days back she noticed some cough which resolved on its own and patient was doing well until his cardiac arrest occurred.  Patient was initially full code and after extensive discussion by Dr. Fanny Bien of emergency room and by me patient was made DO NOT RESUSCITATE. Daughter would like continuation of went and pressor support until further family arrives.  PAST MEDICAL HISTORY:   Past Medical History  Diagnosis Date  . Diabetes mellitus without complication (HCC)   . Hypertension   . Dementia   . Arthritis   . HIV (human immunodeficiency virus infection) (HCC)     PAST SURGICAL  HISTORY:  No past surgical history on file.  SOCIAL HISTORY:   Social History  Substance Use Topics  . Smoking status: Former Games developer  . Smokeless tobacco: Not on file  . Alcohol Use: No    FAMILY HISTORY:   Family History  Problem Relation Age of Onset  . Prostate cancer Father   . Alzheimer's disease Sister     DRUG ALLERGIES:   Allergies  Allergen Reactions  . Aricept [Donepezil Hcl] Other (See Comments)    Unknown reaction    REVIEW OF SYSTEMS:   Review of Systems  Unable to perform ROS: intubated    MEDICATIONS AT HOME:   Prior to Admission medications   Medication Sig Start Date End Date Taking? Authorizing Provider  albuterol (PROVENTIL HFA;VENTOLIN HFA) 108 (90 Base) MCG/ACT inhaler Inhale 2 puffs into the lungs every 6 (six) hours as needed for wheezing or shortness of breath. 07/12/15   Milagros Loll, MD  aspirin 81 MG tablet Take 81 mg by mouth daily.    Historical Provider, MD  divalproex (DEPAKOTE) 125 MG DR tablet Take 250 mg by mouth 2 (two) times daily.    Historical Provider, MD  docusate sodium (COLACE) 50 MG capsule Take 50 mg by mouth daily.    Historical Provider, MD  dorzolamide-timolol (COSOPT) 22.3-6.8 MG/ML ophthalmic solution Place 1 drop into both eyes 2 (two) times daily.    Historical Provider, MD  emtricitabine-tenofovir (TRUVADA) 200-300 MG tablet Take 1 tablet by mouth every other day.    Historical Provider, MD  glipiZIDE (GLUCOTROL) 5 MG tablet Take 5 mg by mouth 2 (two) times daily.    Historical Provider, MD  insulin glargine (LANTUS) 100 unit/mL SOPN Inject 0.14 mLs (14 Units total) into the skin at bedtime. 07/12/15   Jacquetta Polhamus, MD  latanoprost (XALATAN) 0.005 % ophthalmic solution Place 1 drop into the left eye at bedtime.    Historical Provider, MD  levofloxacin (LEVAQUIN) 250 MG tablet Take 1 tablet (250 mg total) by mouth daily. 07/12/15   Anselma Herbel, MD  lisinopril (PRINIVIL,ZESTRIL) 20 MG tablet Take 20 mg by mouth daily.     Historical Provider, MD  memantine (NAMENDA XR) 28 MG CP24 24 hr capsule Take 28 mg by mouth daily.    Historical Provider, MD  Multiple Vitamins-Minerals (MULTIVITAMIN ADULTS 50+ PO) Take 1 tablet by mouth daily.    Historical Provider, MD  nevirapine (VIRAMUNE) 200 MG tablet Take 200 mg by mouth daily.    Historical Provider, MD  omeprazole (PRILOSEC OTC) 20 MG tablet Take 20 mg by mouth daily.    Historical Provider, MD  ondansetron (ZOFRAN) 4 MG tablet Take 4 mg by mouth every 4 (four) hours as needed for nausea or vomiting.    Historical Provider, MD  predniSONE (DELTASONE) 50 MG tablet Take 1 tablet (50 mg total) by mouth daily with breakfast. 07/12/15   Milagros Loll, MD  sulindac (CLINORIL) 200 MG tablet Take 100 mg by mouth 2 (two) times daily.    Historical Provider, MD     VITAL SIGNS:  Blood pressure 83/61, pulse 79, temperature 96.9 F (36.1 C), resp. rate 23, weight 90.719 kg (200 lb), SpO2 94 %.  PHYSICAL EXAMINATION:  Physical Exam  GENERAL:  82 y.o.-year-old patient lying in the bed unresponsive and intubated.No response to pain stimulation EYES:Pupils nonreactive both sides. Patient has loss of vision on left. He had surgeries with irregular pupil on the right.  HEENT: Head atraumatic, normocephalic..moist oral mucosa  NECK:  Supple, no jugular venous distention. No thyroid enlargement, no tenderness.  LUNGS:Entry on both sides with wheezing on the left side  CARDIOVASCULAR: S1, S2  ABDOMEN: Soft, nondistended. Bowel sounds present. No organomegaly or mass.  EXTREMITIES: No pedal edema. PSYCHIATRIC: The patient is unresponsive    LABORATORY PANEL:   CBC  Recent Labs Lab 10-Sep-2015 1532  WBC 23.4*  HGB 11.2*  HCT 35.9*  PLT 119*   ------------------------------------------------------------------------------------------------------------------  Chemistries   Recent Labs Lab 09/10/15 1532  NA 144  K 4.5  CL 118*  CO2 12*  GLUCOSE 100*  BUN 27*   CREATININE 2.60*  CALCIUM 7.7*  AST 164*  ALT 35  ALKPHOS 205*  BILITOT 2.2*   ------------------------------------------------------------------------------------------------------------------  Cardiac Enzymes  Recent Labs Lab 2015/09/10 1532  TROPONINI 0.29*   ------------------------------------------------------------------------------------------------------------------  RADIOLOGY:  Ct Head Wo Contrast  2015-09-10  CLINICAL DATA:  Cardiac arrest EXAM: CT HEAD WITHOUT CONTRAST TECHNIQUE: Contiguous axial images were obtained from the base of the skull through the vertex without intravenous contrast. COMPARISON:  07/09/2015 FINDINGS: Motion degraded images. No evidence of parenchymal hemorrhage or extra-axial fluid collection. No mass lesion, mass effect, or midline shift. No CT evidence of acute infarction. Subcortical white matter and periventricular small vessel ischemic changes. Intracranial atherosclerosis. Global cortical atrophy.  Secondary mild ventricular prominence. Partial opacification of the bilateral ethmoid, maxillary, and sphenoid sinuses. Mastoid air cells are clear. No evidence of calvarial fracture. IMPRESSION: Motion degraded images. No evidence of acute intracranial abnormality. Atrophy with small vessel ischemic changes. Electronically Signed  By: Charline Bills M.D.   On: 2015/09/10 16:34   Dg Chest Port 1 View  09-10-15  CLINICAL DATA:  Cardiac arrest EXAM: PORTABLE CHEST 1 VIEW COMPARISON:  07/10/2015 FINDINGS: Endotracheal tube is 3 cm above the carina. Mild cardiomegaly and vascular congestion. Bibasilar atelectasis. No effusions or pneumothorax. IMPRESSION: Endotracheal tube 3 cm above the carina. Cardiomegaly, vascular congestion and bibasilar atelectasis. Electronically Signed   By: Charlett Nose M.D.   On: 09/10/15 15:27     IMPRESSION AND PLAN:   * Witnessed cardiac arrest with CPR greater than 20 minutes and repeated cardiac arrest. Patient  presently is unresponsive off sedation while intubated. He is needing to pressors. After long discussion with the family he has been made DO NOT RESUSCITATE. Seen by Dr. Juliann Pares of cardiology and did not think he had an MI or needing any further investigation. Discussed with on-call intensivist. No hypothermia. At this point plan is to see if patient improves while more family comes in to see the family. Admit to ICU. Daughter is still contemplating possible comfort measures if there is no improvement. This needs to be discussed tomorrow morning.  * Septic shock Had recent pneumonia. Chest x-ray shows no pneumonia. But WBC count is 23,000. Send blood cultures. Start broad-spectrum antibiotics. Repeat labs in the morning.  * Acute renal failure over CKD stage III due to sepsis and ATN Start patient on IV fluids and monitor input and output  * Mild elevation troponin likely from the CPR.  * DVT prophylaxis Patient presently has some hematuria. Would start on heparin once this is improved.  All the records are reviewed and case discussed with ED provider. Intensivist. Management plans discussed with the patient, family and they are in agreement.  Explained poor prognosis to daughter due to extended time of CPR and unresponsiveness observation. Critically ill with high risk for further cardiac arrest and death.  CODE STATUS: DNR  TOTAL CC TIME TAKING CARE OF THIS PATIENT: 40 minutes.  Milagros Loll R M.D on 10-Sep-2015 at 5:23 PM  Between 7am to 6pm - Pager - [phone]  After 6pm go to [email or web address] - password [email or web address] [facility]  [place] [facility]  Office  [phone]  CC: Primary care physician; Derwood Kaplan, MD  Note: This dictation was prepared with Dragon dictation along with smaller phrase technology. Any transcriptional errors that result from this process are unintentional.

## 2015-09-24 NOTE — Code Documentation (Signed)
Radial pulse palpated

## 2015-09-24 NOTE — Therapy (Signed)
Patient transported to CCU 14 via portable vent. Placed on Servo i in room at ordered settings report given to Angie RRT

## 2015-09-24 NOTE — Code Documentation (Signed)
Pulses no longer palpable. CPR started. PEA per Dr. Fanny Bien

## 2015-09-24 NOTE — ED Provider Notes (Signed)
[facility] Emergency Department Provider Note  ____________________________________________  Time seen: Approximately 3:34 PM  I have reviewed the triage vital signs and the nursing notes.   HISTORY  Chief Complaint Cardiac Arrest  EM caveat: Patient active cardiac arrest, unresponsive  HPI Terry Perry is a 82 y.o. male history of HIV, diabetes.  The patient evidently had a witnessed arrest, for which CPR was immediately started. On EMS arrival he was asystolic and never had a shockable rhythm. He received approximately 4-5 rounds of epinephrine and CPR with return of circulation. He had a confirmed King airway placed and was transported to the ER for which she rearrested just prior to arrival.  On arrival the patient is an active cardiac arrest unresponsive   Past Medical History  Diagnosis Date  . Diabetes mellitus without complication (HCC)   . Hypertension   . Dementia   . Arthritis   . HIV (human immunodeficiency virus infection) [facility])     Patient Active Problem List   Diagnosis Date Noted  . Pneumonia 07/09/2015    No past surgical history on file.  Current Outpatient Rx  Name  Route  Sig  Dispense  Refill  . albuterol (PROVENTIL HFA;VENTOLIN HFA) 108 (90 Base) MCG/ACT inhaler   Inhalation   Inhale 2 puffs into the lungs every 6 (six) hours as needed for wheezing or shortness of breath.   1 Inhaler   0   . aspirin 81 MG tablet   Oral   Take 81 mg by mouth daily.         . divalproex (DEPAKOTE) 125 MG DR tablet   Oral   Take 250 mg by mouth 2 (two) times daily.         Marland Kitchen docusate sodium (COLACE) 50 MG capsule   Oral   Take 50 mg by mouth daily.         . dorzolamide-timolol (COSOPT) 22.3-6.8 MG/ML ophthalmic solution   Both Eyes   Place 1 drop into both eyes 2 (two) times daily.         Marland Kitchen emtricitabine-tenofovir (TRUVADA) 200-300 MG tablet   Oral   Take 1 tablet by mouth every other day.         Marland Kitchen glipiZIDE  (GLUCOTROL) 5 MG tablet   Oral   Take 5 mg by mouth 2 (two) times daily.         . insulin glargine (LANTUS) 100 unit/mL SOPN   Subcutaneous   Inject 0.14 mLs (14 Units total) into the skin at bedtime.   15 mL   11   . latanoprost (XALATAN) 0.005 % ophthalmic solution   Left Eye   Place 1 drop into the left eye at bedtime.         Marland Kitchen levofloxacin (LEVAQUIN) 250 MG tablet   Oral   Take 1 tablet (250 mg total) by mouth daily.   7 tablet   0   . lisinopril (PRINIVIL,ZESTRIL) 20 MG tablet   Oral   Take 20 mg by mouth daily.         . memantine (NAMENDA XR) 28 MG CP24 24 hr capsule   Oral   Take 28 mg by mouth daily.         . Multiple Vitamins-Minerals (MULTIVITAMIN ADULTS 50+ PO)   Oral   Take 1 tablet by mouth daily.         . nevirapine (VIRAMUNE) 200 MG tablet   Oral   Take 200 mg by  mouth daily.         Marland Kitchen omeprazole (PRILOSEC OTC) 20 MG tablet   Oral   Take 20 mg by mouth daily.         . ondansetron (ZOFRAN) 4 MG tablet   Oral   Take 4 mg by mouth every 4 (four) hours as needed for nausea or vomiting.         . predniSONE (DELTASONE) 50 MG tablet   Oral   Take 1 tablet (50 mg total) by mouth daily with breakfast.   5 tablet   0   . sulindac (CLINORIL) 200 MG tablet   Oral   Take 100 mg by mouth 2 (two) times daily.           Allergies Aricept  Family History  Problem Relation Age of Onset  . Prostate cancer Father   . Alzheimer's disease Sister     Social History Social History  Substance Use Topics  . Smoking status: Former Games developer  . Smokeless tobacco: Not on file  . Alcohol Use: No    Review of Systems Patient's family reports he was healthy until arrest occurred and was sudden while seated at the table eating  ____________________________________________   PHYSICAL EXAM:  VITAL SIGNS: ED Triage Vitals  Enc Vitals Group     BP September 20, 2015 1530 89/65 mmHg     Pulse Rate 20-Sep-2015 1530 74     Resp 20-Sep-2015 1530 21      Temp 09-20-2015 1530 99 F (37.2 C)     Temp src --      SpO2 September 20, 2015 1518 100 %     Weight --      Height --      Head Cir --      Peak Flow --      Pain Score --      Pain Loc --      Pain Edu? --      Excl. in GC? --    Constitutional: Unresponsive Eyes: Conjunctivae are normal. Midline fixed Head: Atraumatic. Nose: No congestion/rhinnorhea. Mouth/Throat: Mucous membranes are dry with King airway in place.   Neck: No stridor.  Cardiovascular: Patient pulseless, Luis device performing compressions on arrival  Respiratory: Clear respirations, end-tidal CO2 mid 30s. Normal waveform. Gastrointestinal: Soft and nontender. Slightly distended. Musculoskeletal: No lower extremity tenderness nor edema.  No joint effusions. Neurologic:  Unresponsive Skin:  Skin is warm, dry and intact. No rash noted.   ____________________________________________   LABS (all labs ordered are listed, but only abnormal results are displayed)  Labs Reviewed  GLUCOSE, CAPILLARY - Abnormal; Notable for the following:    Glucose-Capillary 113 (*)    All other components within normal limits  CBC WITH DIFFERENTIAL/PLATELET - Abnormal; Notable for the following:    WBC 23.4 (*)    RBC 3.43 (*)    Hemoglobin 11.2 (*)    HCT 35.9 (*)    MCV 104.5 (*)    MCHC 31.3 (*)    RDW 16.3 (*)    Platelets 119 (*)    Neutro Abs 18.4 (*)    Lymphs Abs 4.0 (*)    All other components within normal limits  AMMONIA - Abnormal; Notable for the following:    Ammonia 44 (*)    All other components within normal limits  LACTIC ACID, PLASMA - Abnormal; Notable for the following:    Lactic Acid, Venous 3.2 (*)    All other components within normal limits  CULTURE, BLOOD (  ROUTINE X 2)  CULTURE, BLOOD (ROUTINE X 2)  CBC WITH DIFFERENTIAL/PLATELET  BLOOD GAS, ARTERIAL  COMPREHENSIVE METABOLIC PANEL  LIPASE, BLOOD  TROPONIN I  LACTIC ACID, PLASMA  CBG MONITORING, ED    ____________________________________________  EKG  Reviewed and interpreted by me at 1440 Also reviewed in real-time by Dr. Juliann Pares who came to the patient's bedside from cardiology [address] QRS 146 QTc 480 Questionable T wave inversions in the lateral distribution potential for ischemia, but no evidence of ST elevation MI Both Dr. Juliann Pares and I reviewed the EKG and reach similar conclusion that ischemic change cannot be excluded, however currently no evidence to indicate STEMI  ____________________________________________  RADIOLOGY   CT Head Wo Contrast (Final result) Result time: 2015-09-12 16:34:04   Final result by Rad Results In Interface (Sep 12, 2015 16:34:04)   Narrative:   CLINICAL DATA: Cardiac arrest  EXAM: CT HEAD WITHOUT CONTRAST  TECHNIQUE: Contiguous axial images were obtained from the base of the skull through the vertex without intravenous contrast.  COMPARISON: 07/09/2015  FINDINGS: Motion degraded images.  No evidence of parenchymal hemorrhage or extra-axial fluid collection. No mass lesion, mass effect, or midline shift.  No CT evidence of acute infarction.  Subcortical white matter and periventricular small vessel ischemic changes. Intracranial atherosclerosis.  Global cortical atrophy. Secondary mild ventricular prominence.  Partial opacification of the bilateral ethmoid, maxillary, and sphenoid sinuses. Mastoid air cells are clear.  No evidence of calvarial fracture.  IMPRESSION: Motion degraded images.  No evidence of acute intracranial abnormality.  Atrophy with small vessel ischemic changes.   Electronically Signed By: Charline Bills M.D. On: 2015-09-12 16:34          DG Chest Port 1 View (Final result) Result time: Sep 12, 2015 15:27:34   Final result by Rad Results In Interface (12-Sep-2015 15:27:34)   Narrative:   CLINICAL DATA: Cardiac arrest  EXAM: PORTABLE CHEST 1 VIEW  COMPARISON:  07/10/2015  FINDINGS: Endotracheal tube is 3 cm above the carina. Mild cardiomegaly and vascular congestion. Bibasilar atelectasis. No effusions or pneumothorax.  IMPRESSION: Endotracheal tube 3 cm above the carina.  Cardiomegaly, vascular congestion and bibasilar atelectasis.   Electronically Signed By: Charlett Nose M.D. On: 12-Sep-2015 15:27    ____________________________________________   PROCEDURES  Procedure(s) performed: intubation  Critical Care performed: Yes, see critical care note(s)  CRITICAL CARE Performed by: Sharyn Creamer   INTUBATION Performed by: Sharyn Creamer  Required items: required blood products, implants, devices, and special equipment available Patient identity confirmed: provided demographic data and hospital-assigned identification number Time out: Immediately prior to procedure a "time out" was called to verify the correct patient, procedure, equipment, support staff and site/side marked as required.  Indications: Cardiac arrest   Intubation method: 3 Glidescope Laryngoscopy   Preoxygenation: BVM  Sedatives: None patient unresponsive   Tube Size: 7.5 cuffed  Post-procedure assessment: chest rise and ETCO2 monitor patient with good Square waveform excellent end-tidal tracing and 30s post intubation. No complication, successful first attempt. Breath sounds: equal and absent over the epigastrium Tube secured with: ETT holder Chest x-ray interpreted by radiologist and me.  Chest x-ray findings: endotracheal tube in appropriate position  Patient tolerated the procedure well with no immediate complications.      Total critical care time: 80 minutes  Critical care time was exclusive of separately billable procedures and treating other patients.  Critical care was necessary to treat or prevent imminent or life-threatening deterioration.  Critical care was time spent personally by me on the following activities: development  of  treatment plan with patient and/or surrogate as well as nursing, discussions with consultants, evaluation of patient's response to treatment, examination of patient, obtaining history from patient or surrogate, ordering and performing treatments and interventions, ordering and review of laboratory studies, ordering and review of radiographic studies, pulse oximetry and re-evaluation of patient's condition.  ____________________________________________   INITIAL IMPRESSION / ASSESSMENT AND PLAN / ED COURSE  Pertinent labs & imaging results that were available during my care of the patient were reviewed by me and considered in my medical decision making (see chart for details).  Patient presents in acute asystolic arrest. Multiple unfavorable factors including a systolic condition, rearrest, age greater than 28.  Broad differential diagnosis including acute cardiac, toxic metabolic, infectious, aspiration, and cranial hemorrhage, dissection, pulmonary embolism, all considered. Based on the patient's current condition focused upon resuscitation efforts, obtain CT of the head and anticipate admission to the ICU. ----------------------------------------- 320 PM on 08-29-15 ----------------------------------------- Patient condition improved, placing on pressors. Remains unresponsive vital signs improving. Return of circulation  ----------------------------------------- 415 PM on 08-29-2015 ----------------------------------------- Patient family at bedside, understanding critical nature. Review of available labs and chemistry is still pending. We'll cover with antibiotics given elevated white count. CT of the head pending read. Patient remains critical, severely poor prognosis however continue resuscitation effort.   ----------------------------------------- 4:48 PM on 08/29/15 -----------------------------------------  Ongoing care and disposition assigned the hospitalist service. The  patient is DO NOT RESUSCITATE, however okay for mechanical ventilation. Should he rearrest we will not perform CPR or defibrillation. Discussion with the patient's daughter who presents her self as his healthcare power of attorney, and is listed as emergency contact in his paperwork. After discussion and her seeing her father decision was made to make him DO NOT RESUSCITATE understanding that if his heart were to stop but would not be attempted to restart but she does request we continue with ventilation, fluids, and appropriate medications which we will continue.  Still await chemistry. Head CT does not indicate intracranial hemorrhage. Discussed with Dr. Juliann Pares he came to patient bedside and indicates that acute catheterization is not indicated and I would agree. In addition discussed with critical care doctor Kasa, who advises against hypothermic treatment/code ice given patient age, asystolic arrest. I'm also in agreement with this.  Patient will be admitted to the ICU. Etiology of cardiac arrest still slightly unclear, however he is extremely unstable and has rearrested multiple times. Family at bedside and appreciative of care he has received.  Ongoing care and disposition assigned to Dr. Elpidio Anis and Dr. Sharma Covert (ER.) Dr. Elpidio Anis seeing to admit now. Chemistry pending. ____________________________________________   FINAL CLINICAL IMPRESSION(S) / ED DIAGNOSES  Final diagnoses:  Cardiac arrest (HCC)      Sharyn Creamer, MD 08-29-15 [phone]

## 2015-09-24 NOTE — Progress Notes (Signed)
Patient tx from ED to room 14. When I entered in room patient was disconnected from ED portable monitor. I hooked patient up to the EKG monitor and patients rhythm was asystole. No pulse found. Dr Wess Botts on unit and she pronounced patient. Dopamine and Epi were infusing at the time. Orders per MD to stop. Dr Wess Botts spoke with family and pastoral care. Charge informed Dr Allena Katz, supervisor and copa.

## 2015-09-24 NOTE — Code Documentation (Signed)
Pulse check, no palpable pulse, agonal breathing, PEA per Dr. Fanny Bien

## 2015-09-24 NOTE — Therapy (Signed)
Patient transported to, during and from CT via portable vent without incident. MVB/mask available. Back in room plugged into dedicated outlet and wall O2.

## 2015-09-24 NOTE — Progress Notes (Signed)
Death Pronouncement   Pt was admitted to ED following a witnessed cardio-respiratory arrest in his home where bystander CPR was initiated (by wife).  He then had a second arrest in the ED and was resuscitated again. He came to the ICU intubated and on an epi drip and dopamine drip.  He was found on arrival to have gone into complete asystole.  He was apneic.  Soon, he had some agonal beats but no perfusion.  I was going to stay here to consult on pt and talk with family, and was nearby when he was found to have died.  Daughter was here along with a niece and a deacon from their church.  Pt has a h/o recent pneumonia treated in a facility.  Daughter said he just got home from the facility.  His WBC was 23,000 and he was felt to have septic shock which was likely the cause of his demise.    I spoke with family and gave them the news and also am pronouncing pt formally as deceased at 6:40 pm.  He had no pulses, no respirations over vent, no heart sounds, no breath sounds, and pupils became fixed and dilated.    I will update on call [facility] physician.     Suan Halter, MD

## 2015-09-24 NOTE — Discharge Summary (Signed)
[facility] - [place] at [facility]    DEATH NOTE  PATIENT NAME: Terry Perry    MR#:  [id number]  DATE OF BIRTH:  01-Nov-1922  DATE OF ADMISSION:  2015/09/23  PRIMARY CARE PHYSICIAN: Derwood Kaplan, MD   Pronounced dead  on September 23, 2015  at 6.40 PM           CAUSE OF DEATH -  Septic shock  Other diagnosis  Cardiac arrest Elevated troponin Acute hypoxic respiratory failure with vent dependent Acute renal failure   ADMISSION DIAGNOSIS:  Cardiac arrest (HCC) [I46.9]  HISTORY OF PRESENT ILLNESS ON ADMISSION   Terry Perry is a 82 y.o. male with a known history of diabetes, hypertension, dementia, HIV who was recently in the hospital for pneumonia returns to the emergency room due to cardiac arrest. History obtained from reviewing old records and daughter at bedside and discussing with ED physician and staff. Patient is presently intubated and unresponsive. Patient had a witnessed cardiac arrest at daughter's home and CPR was started by the daughter and the caretaker. When EMS arrived patient had asystole CPR continued for 20 minutes with 4-5 rounds of epinephrine. On arrival to emergency room patient was found to be in PEA and further CPR was conducted. Now he is on 2 pressors with epinephrine and dopamine. Off sedation without any response. Breathing over the vent. CT scan of the head showed nothing acute. Elevated WBC count of 23,000.  Daughter mentions that patient improved with his pneumonia and bronchitis well and returned to his baseline. 2 days back she noticed some cough which resolved on its own and patient was doing well until his cardiac arrest occurred.  Patient was initially full code and after extensive discussion by Dr. Fanny Bien of emergency room and by me patient was made DO NOT RESUSCITATE. Daughter would like continuation of went and pressor support until further family arrives.  HOSPITAL COURSE:   Patient was admitted to the ICU on 2  pressors, IV antibiotics with cultures pending. He was continued on full ventilatory support. As per daughter's request patient was made DO NOT RESUSCITATE. After arrival to the ICU patient had asystole on the monitor. This point Dr. Orvan Falconer with palliative care pronounced the patient ate at 6:40 PM. Patient had very poor prognosis from the beginning with the amount of resuscitation.Orie Fisherman M.D on 08/25/2015 at 3:48 PM  [facility] Physicians - [place] at [facility]    OFFICE [phone]  Total clinical and documentation time for today Under 30 minutes

## 2015-09-24 NOTE — Therapy (Signed)
Called to room, CPR in progress. Patient found with LMA placed in field. Dr.Quale with MVB, chest compressions being done by mechanical device. Pulse regained. Patient orally intubated by Dr.Quale with #7.5 ETT, placement verified by capnography, breath sounds, chest rise and CXR. Pulse temporarily lost, CPR/ACLS performed, pulse regained. Patient placed on vent at documented settings.

## 2015-09-24 DEATH — deceased

## 2017-12-30 IMAGING — CT CT HEAD W/O CM
1 series · 16 of 30 positions shown, 20 images · non-contrast
Comparison: 11/19/2009

CLINICAL DATA: Found unconscious. Decreased mental status. Right
facial droop.

EXAM:
CT HEAD WITHOUT CONTRAST
TECHNIQUE: Contiguous axial images were obtained from the base of the skull
through the vertex without intravenous contrast.

[Series 2: head wo · axial · 0.43mm/px · z∈[+314,+448]mm · 16 of 31 slices shown, 20 images]
[im 2/31  brain]
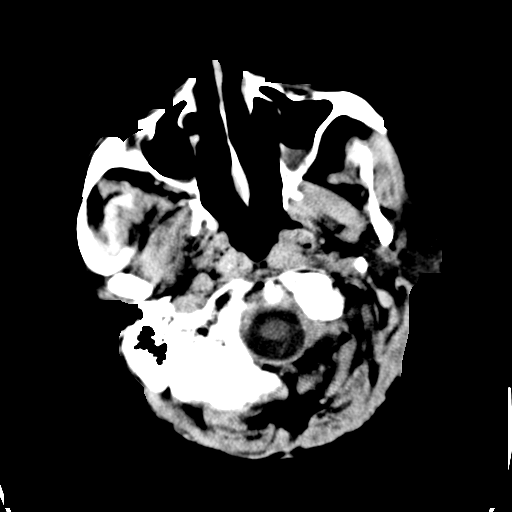
[im 2/31  bone]
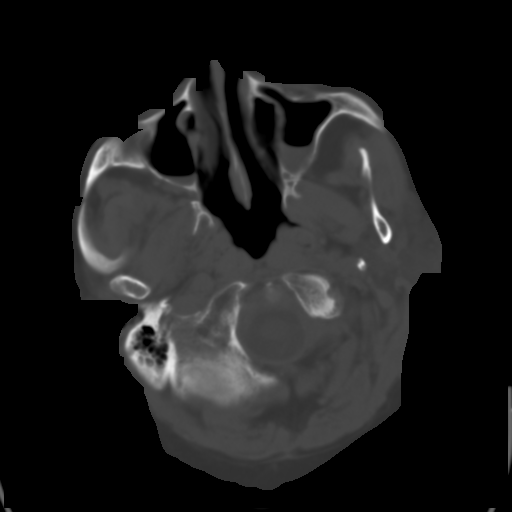
[im 4/31  brain]
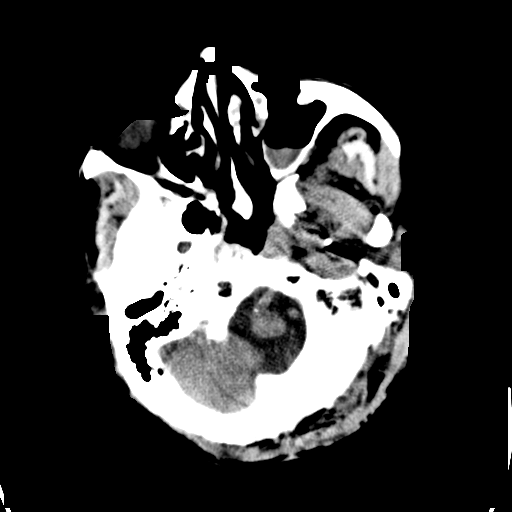
[im 6/31  brain]
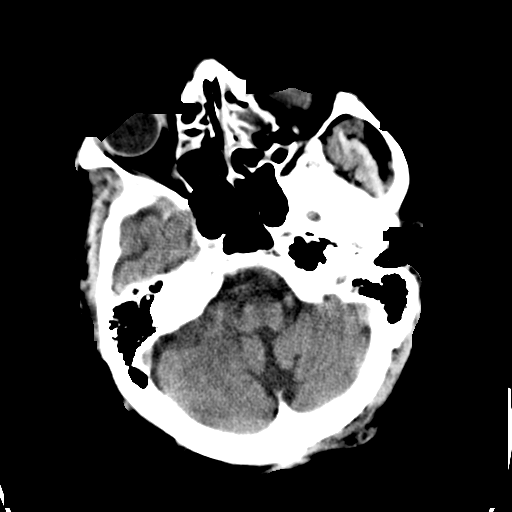
[im 8/31  brain]
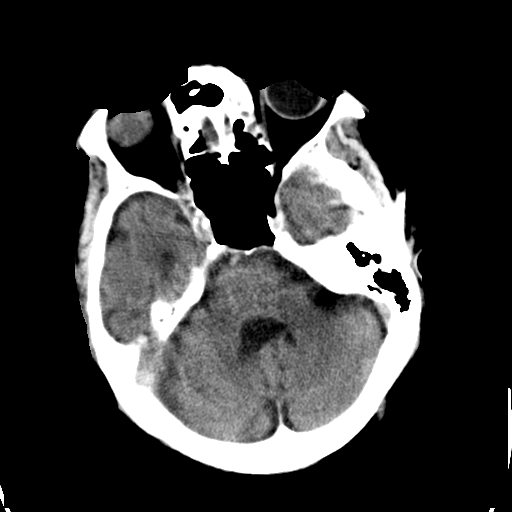
[im 9/31  brain]
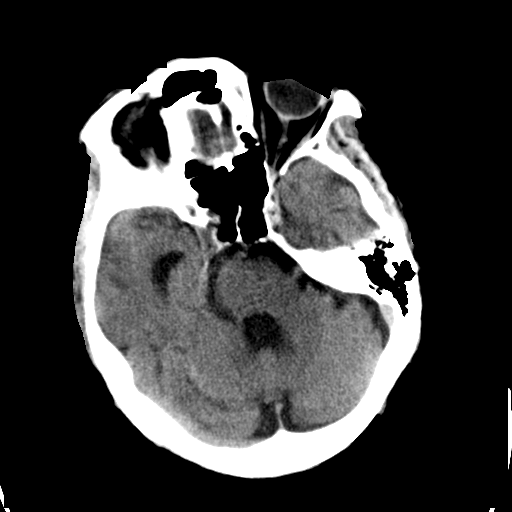
[im 9/31  bone]
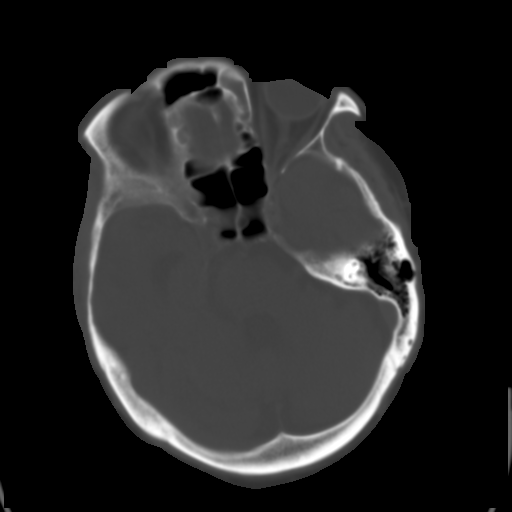
[im 11/31  brain]
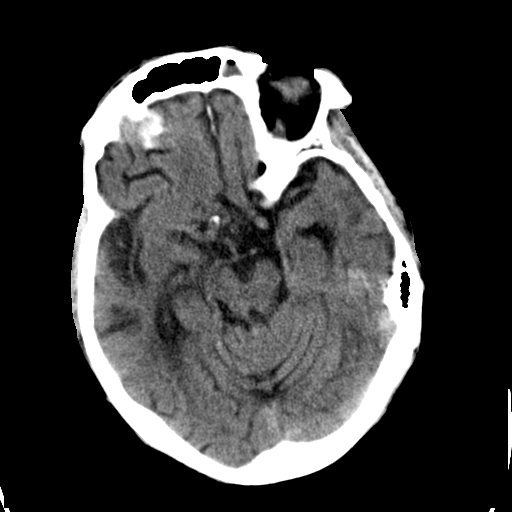
[im 13/31  brain]
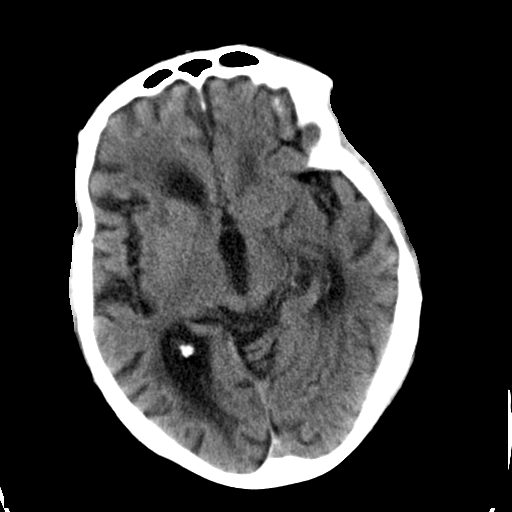
[im 15/31  brain]
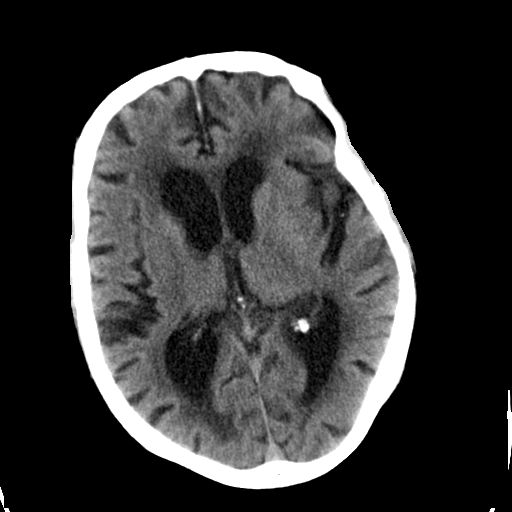
[im 16/31  brain]
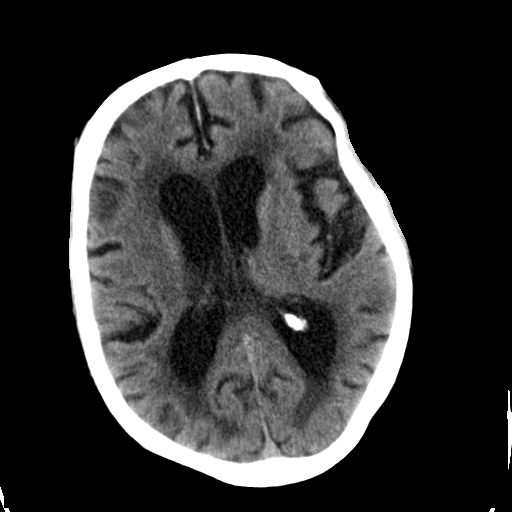
[im 16/31  bone]
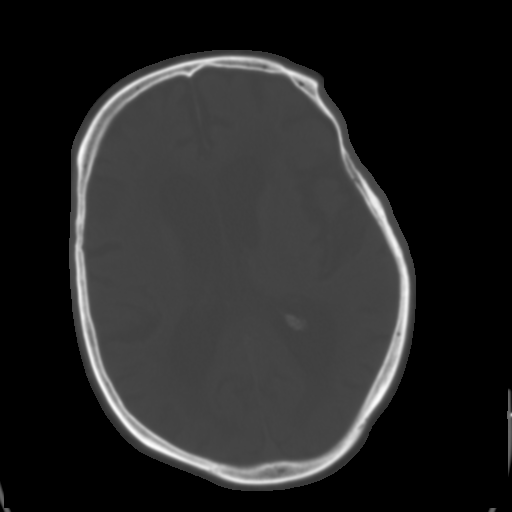
[im 18/31  brain]
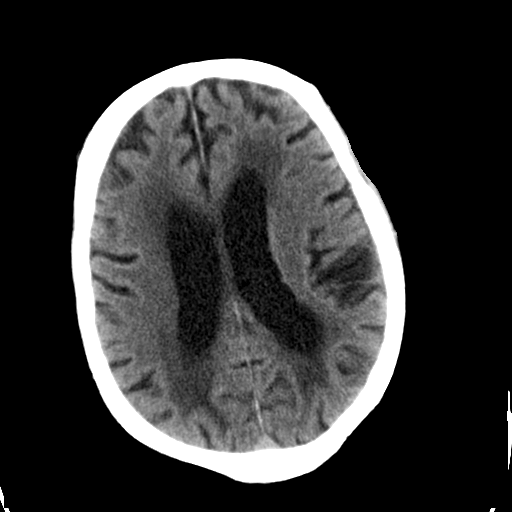
[im 20/31  brain]
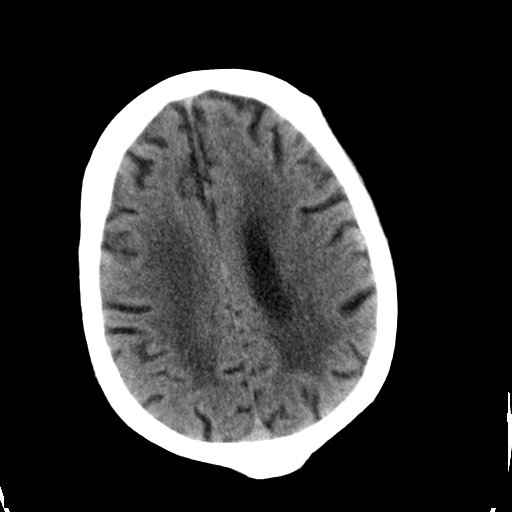
[im 22/31  brain]
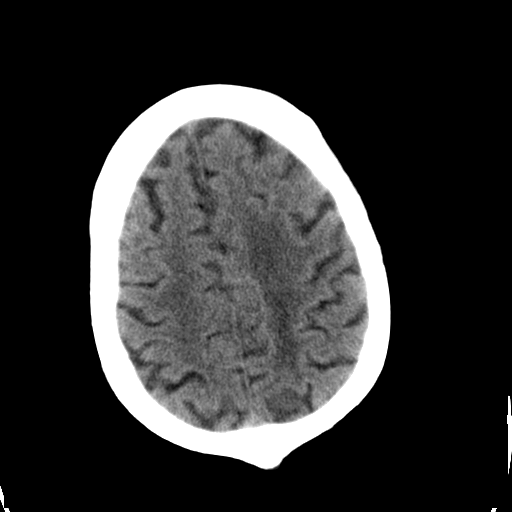
[im 23/31  brain]
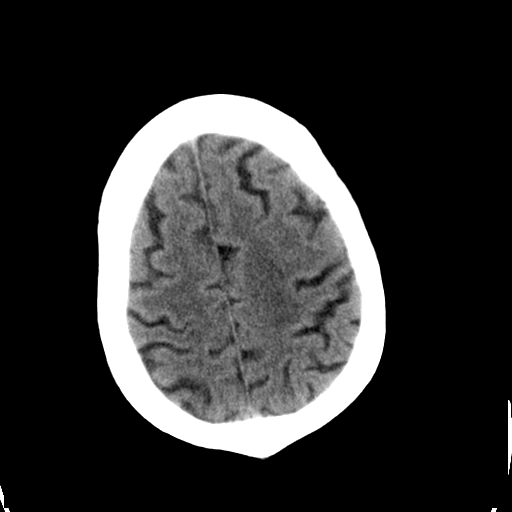
[im 23/31  bone]
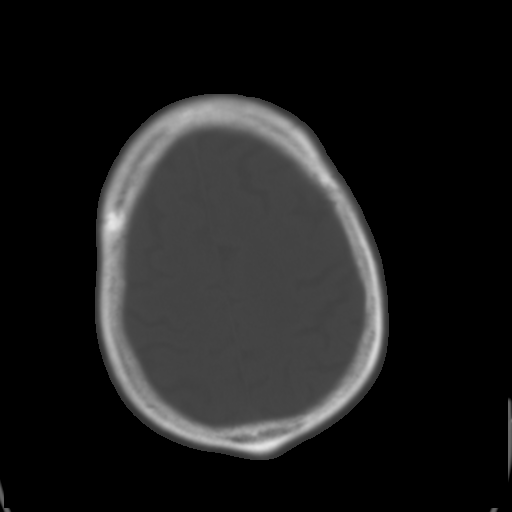
[im 25/31  brain]
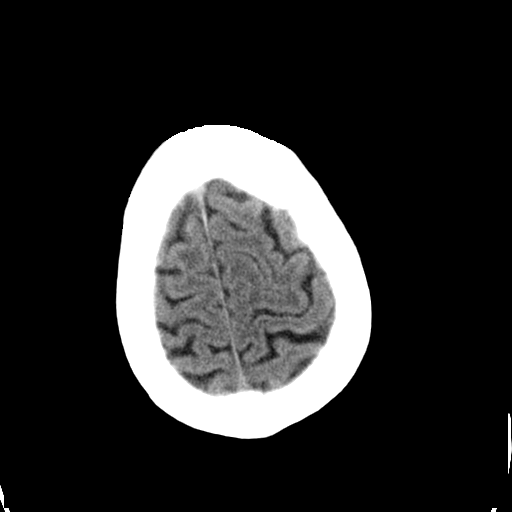
[im 27/31  brain]
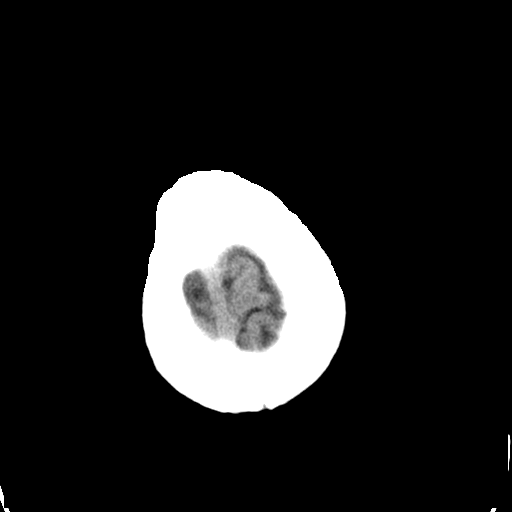
[im 29/31  brain]
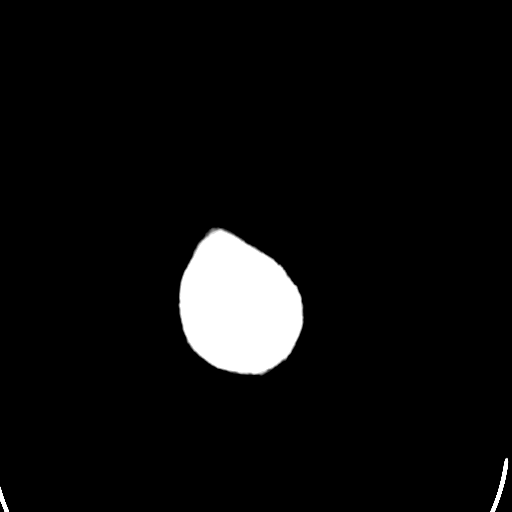

[16 of 30 positions shown; findings below may reference images not displayed]

FINDINGS: There is no evidence of intracranial hemorrhage, brain edema, or
other signs of acute infarction. There is no evidence of
intracranial mass lesion or mass effect. No abnormal extraaxial
fluid collections are identified.

Moderate cerebral atrophy and extensive chronic small vessel disease
are again demonstrated. Ventriculomegaly again noted. No skull
abnormality identified. Air-fluid levels are noted in the left
frontal and bilateral maxillary sinuses, suspicious for acute
sinusitis. Mucosal thickening also seen involving the ethmoid
sinuses.
IMPRESSION: No acute intracranial abnormality. Cerebral atrophy and chronic
small vessel disease.

Bilateral maxillary and left frontal sinus air-fluid levels,
suspicious for acute sinusitis.

## 2017-12-30 IMAGING — CR DG CHEST 1V PORT
1 series · 2 of 2 positions shown · non-contrast
Comparison: 06/11/2009

CLINICAL DATA: Right-sided facial droop today resolved.

EXAM:
PORTABLE CHEST 1 VIEW

[Series 1: ap · 0.17mm/px · 2 of 2 slices shown]
[im 1/2]
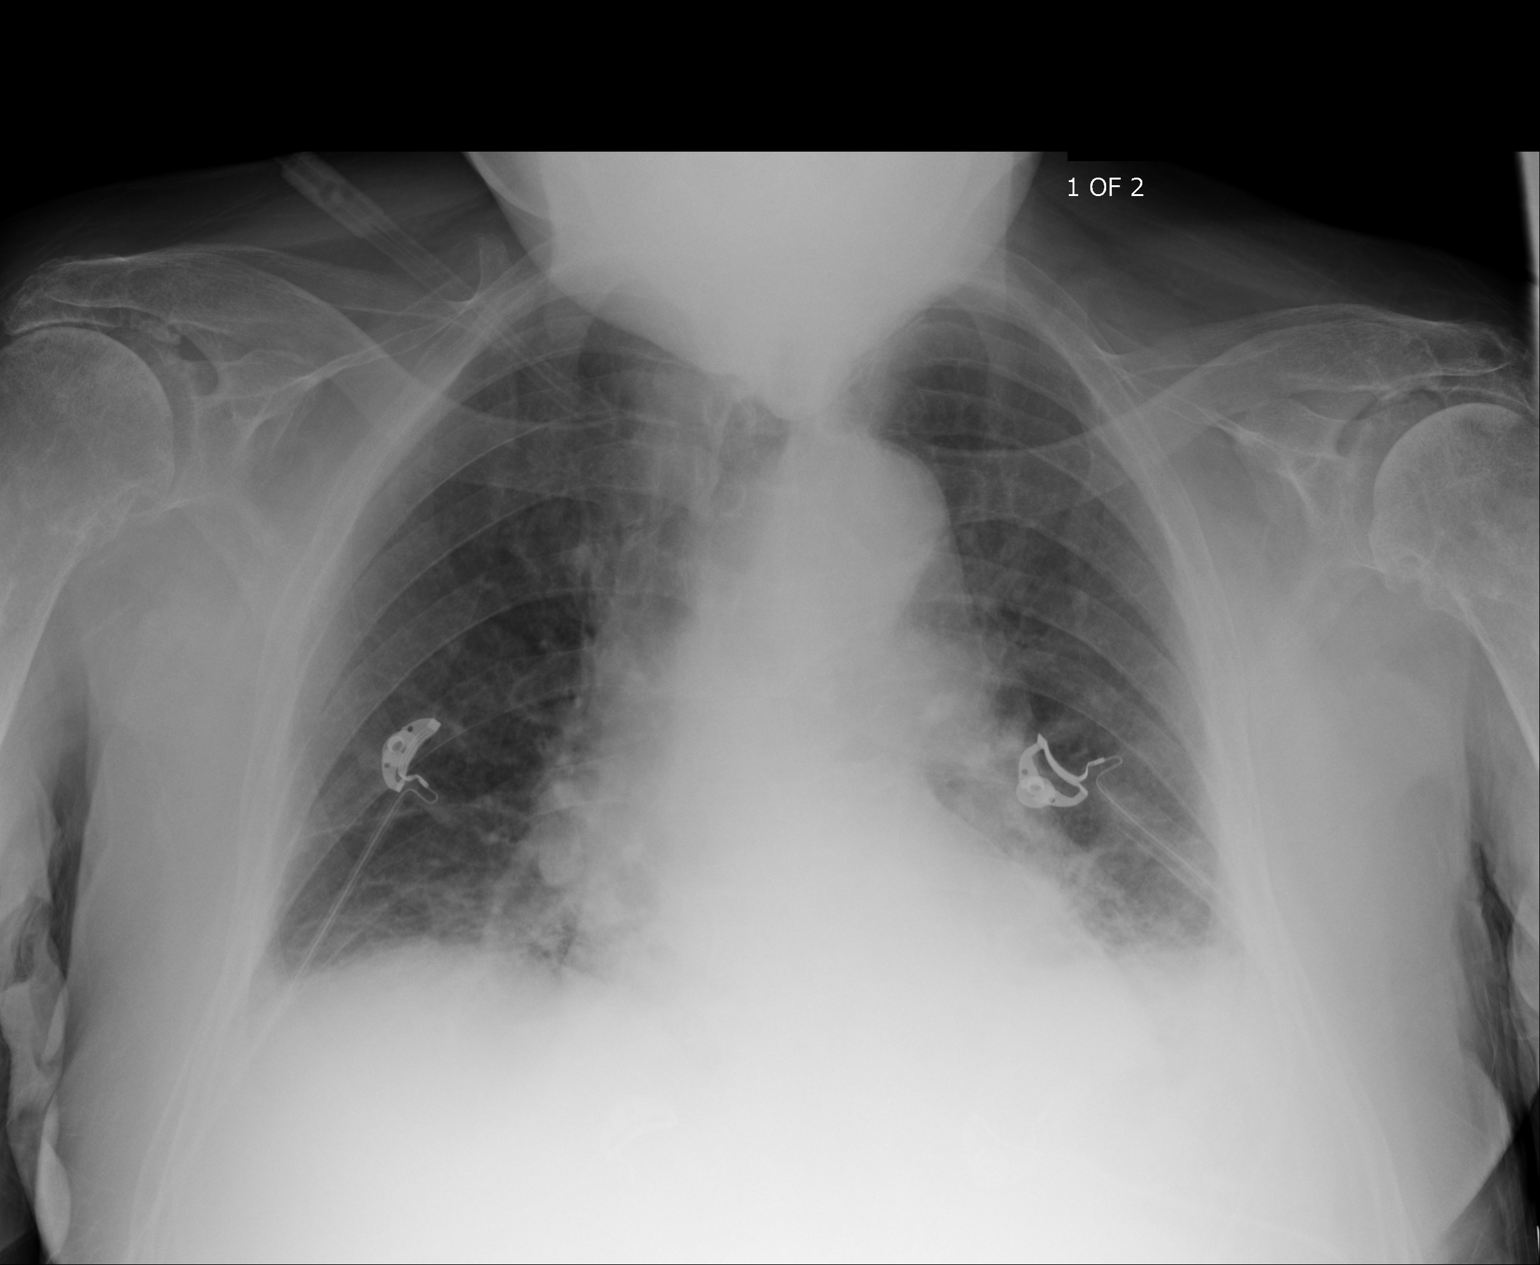
[im 2/2]
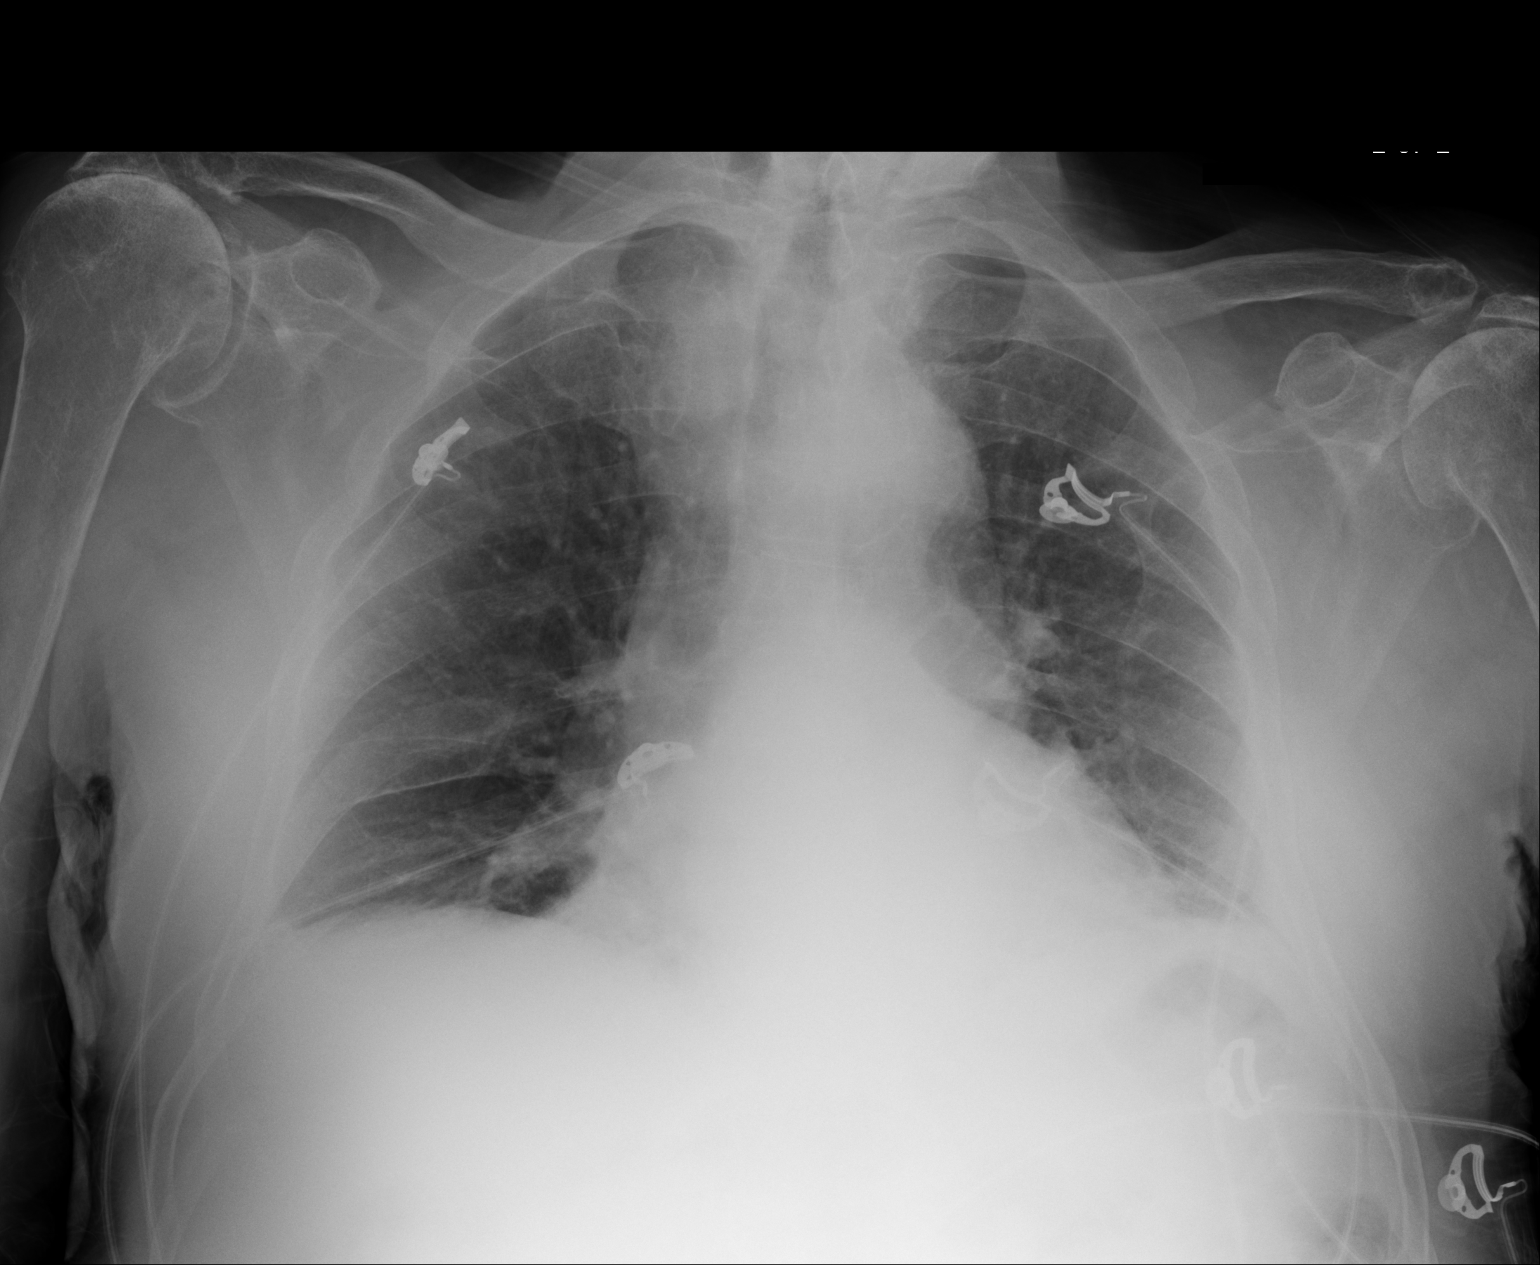

[2 of 2 positions shown; findings below may reference images not displayed]

FINDINGS: Lungs are somewhat hypoinflated with bibasilar opacification left
worse than right which may be due to atelectasis or infection.
Cannot exclude a small amount of bilateral pleural fluid. Borderline
cardiomegaly. Mild calcified plaque over the aortic arch.
Degenerative changes of the shoulders.
IMPRESSION: Hypoinflation with bibasilar opacification which may be due to
atelectasis or infection. Cannot exclude a small amount of bilateral
pleural fluid.
